# Patient Record
Sex: Female | Born: 1965 | Race: White | Hispanic: No | Marital: Single | Smoking: Current every day smoker
Health system: Southern US, Community
[De-identification: ages and names within clinical notes are randomized; demographics above are authoritative.]

## PROBLEM LIST (undated history)

## (undated) DIAGNOSIS — I10 Essential (primary) hypertension: Secondary | ICD-10-CM

## (undated) HISTORY — PX: OTHER SURGICAL HISTORY: SHX169

---

## 2007-07-16 ENCOUNTER — Encounter: Payer: Self-pay | Admitting: Physician Assistant

## 2008-07-15 ENCOUNTER — Inpatient Hospital Stay (HOSPITAL_COMMUNITY): Admission: EM | Admit: 2008-07-15 | Discharge: 2008-07-16 | Payer: Self-pay | Admitting: Emergency Medicine

## 2008-07-15 ENCOUNTER — Ambulatory Visit: Payer: Self-pay | Admitting: Internal Medicine

## 2008-09-24 ENCOUNTER — Ambulatory Visit: Payer: Self-pay | Admitting: Internal Medicine

## 2008-10-16 ENCOUNTER — Encounter: Payer: Self-pay | Admitting: Physician Assistant

## 2008-12-14 ENCOUNTER — Emergency Department (HOSPITAL_COMMUNITY): Admission: EM | Admit: 2008-12-14 | Discharge: 2008-12-15 | Payer: Self-pay | Admitting: Emergency Medicine

## 2010-04-05 NOTE — Letter (Signed)
Summary: RECEIVED MORE RECORDS FROM [facility]  RECEIVED MORE RECORDS FROM [facility]   Imported By: Arta Bruce 12/31/2009 14:34:11  _____________________________________________________________________  External Attachment:    Type:   Image     Comment:   External Document

## 2010-04-05 NOTE — Letter (Signed)
Summary: RECORDS FROM [facility] FROM [facility]   Imported By: Arta Bruce 12/31/2009 12:57:04  _____________________________________________________________________  External Attachment:    Type:   Image     Comment:   External Document

## 2010-06-09 LAB — COMPREHENSIVE METABOLIC PANEL
AST: 107 U/L — ABNORMAL HIGH (ref 0–37)
CO2: 22 mEq/L (ref 19–32)
GFR calc non Af Amer: >60 mL/min (ref >60)
Glucose, Bld: 164 mg/dL — ABNORMAL HIGH (ref 70–99)
Potassium: 3.4 mEq/L — ABNORMAL LOW (ref 3.5–5.1)
Sodium: 132 mEq/L — ABNORMAL LOW (ref 135–145)
Total Bilirubin: 1.3 mg/dL — ABNORMAL HIGH (ref 0.3–1.2)
Total Protein: 8.3 g/dL (ref 6.0–8.3)

## 2010-06-09 LAB — CBC
Hemoglobin: 12.7 g/dL (ref 12.0–15.0)
RBC: 4.15 MIL/uL (ref 3.87–5.11)
RDW: 22 % — ABNORMAL HIGH (ref 11.5–15.5)

## 2010-06-14 LAB — RAPID URINE DRUG SCREEN, HOSP PERFORMED
Amphetamines: NOT DETECTED
Benzodiazepines: NOT DETECTED

## 2010-06-14 LAB — BASIC METABOLIC PANEL
BUN: 12 mg/dL (ref 6–23)
Calcium: 7.9 mg/dL — ABNORMAL LOW (ref 8.4–10.5)
Calcium: 8.2 mg/dL — ABNORMAL LOW (ref 8.4–10.5)
Creatinine, Ser: 0.71 mg/dL (ref 0.4–1.2)
Creatinine, Ser: 0.78 mg/dL (ref 0.4–1.2)
GFR calc Af Amer: >60 mL/min (ref >60)
GFR calc Af Amer: >60 mL/min (ref >60)
GFR calc non Af Amer: >60 mL/min (ref >60)
Sodium: 133 mEq/L — ABNORMAL LOW (ref 135–145)

## 2010-06-14 LAB — DIFFERENTIAL
Basophils Absolute: 0 10*3/uL (ref 0.0–0.1)
Eosinophils Absolute: 0 10*3/uL (ref 0.0–0.7)
Lymphocytes Relative: 13 % (ref 12–46)
Neutro Abs: 4 10*3/uL (ref 1.7–7.7)
Neutrophils Relative %: 85 % — ABNORMAL HIGH (ref 43–77)

## 2010-06-14 LAB — LIPID PANEL
HDL: 74 mg/dL (ref >39)
LDL Cholesterol: 108 mg/dL — ABNORMAL HIGH (ref 0–99)
Total CHOL/HDL Ratio: 2.6 RATIO
Triglycerides: 46 mg/dL (ref <150)
VLDL: 9 mg/dL (ref 0–40)

## 2010-06-14 LAB — URINALYSIS, ROUTINE W REFLEX MICROSCOPIC
Nitrite: NEGATIVE
Specific Gravity, Urine: 1.016 (ref 1.005–1.030)
Urobilinogen, UA: 0.2 mg/dL (ref 0.0–1.0)
pH: 7 (ref 5.0–8.0)

## 2010-06-14 LAB — BRAIN NATRIURETIC PEPTIDE: Pro B Natriuretic peptide (BNP): <30 pg/mL (ref 0.0–100.0)

## 2010-06-14 LAB — URINE MICROSCOPIC-ADD ON

## 2010-06-14 LAB — COMPREHENSIVE METABOLIC PANEL
Alkaline Phosphatase: 65 U/L (ref 39–117)
BUN: 10 mg/dL (ref 6–23)
Glucose, Bld: 126 mg/dL — ABNORMAL HIGH (ref 70–99)
Potassium: 3.5 mEq/L (ref 3.5–5.1)
Total Bilirubin: 0.4 mg/dL (ref 0.3–1.2)
Total Protein: 6.8 g/dL (ref 6.0–8.3)

## 2010-06-14 LAB — RETICULOCYTES
RBC.: 4.04 MIL/uL (ref 3.87–5.11)
Retic Count, Absolute: 28.3 10*3/uL (ref 19.0–186.0)

## 2010-06-14 LAB — CK TOTAL AND CKMB (NOT AT ARMC)
CK, MB: 3.4 ng/mL (ref 0.3–4.0)
Relative Index: INVALID (ref 0.0–2.5)
Total CK: 87 U/L (ref 7–177)

## 2010-06-14 LAB — CARDIAC PANEL(CRET KIN+CKTOT+MB+TROPI)
Relative Index: INVALID (ref 0.0–2.5)
Total CK: 103 U/L (ref 7–177)
Troponin I: 0.01 ng/mL (ref 0.00–0.06)
Troponin I: 0.01 ng/mL (ref 0.00–0.06)

## 2010-06-14 LAB — SEDIMENTATION RATE: Sed Rate: 28 mm/hr — ABNORMAL HIGH (ref 0–22)

## 2010-06-14 LAB — PROTIME-INR
INR: 1 (ref 0.00–1.49)
Prothrombin Time: 12.9 seconds (ref 11.6–15.2)

## 2010-06-14 LAB — TSH: TSH: 0.736 u[IU]/mL (ref 0.350–4.500)

## 2010-06-14 LAB — CBC
HCT: 29.8 % — ABNORMAL LOW (ref 36.0–46.0)
MCHC: 33.4 g/dL (ref 30.0–36.0)
MCV: 77.9 fL — ABNORMAL LOW (ref 78.0–100.0)
Platelets: 135 10*3/uL — ABNORMAL LOW (ref 150–400)
RBC: 4.12 MIL/uL (ref 3.87–5.11)
WBC: 4.7 10*3/uL (ref 4.0–10.5)

## 2010-06-14 LAB — IRON AND TIBC
Iron: 18 ug/dL — ABNORMAL LOW (ref 42–135)
UIBC: 301 ug/dL

## 2010-06-14 LAB — HEMOGLOBIN A1C
Hgb A1c MFr Bld: 4.9 % (ref 4.6–6.1)
Mean Plasma Glucose: 94 mg/dL

## 2010-06-14 LAB — GLUCOSE, CAPILLARY: Glucose-Capillary: 78 mg/dL (ref 70–99)

## 2010-06-14 LAB — ETHANOL: Alcohol, Ethyl (B): 97 mg/dL — ABNORMAL HIGH (ref 0–10)

## 2010-06-14 LAB — TROPONIN I: Troponin I: 0.03 ng/mL (ref 0.00–0.06)

## 2010-07-19 NOTE — Discharge Summary (Signed)
NAME:  Teresa House, Teresa House             ACCOUNT NO.:  [id number]   MEDICAL RECORD NO.:  [id number]          PATIENT TYPE:  INP   LOCATION:  3710                         FACILITY:  [facility]   PHYSICIAN:  Madaline Guthrie, M.D.    DATE OF BIRTH:  1965-07-24   DATE OF ADMISSION:  07/15/2008  DATE OF DISCHARGE:  07/16/2008                               DISCHARGE SUMMARY   Attending: Dr. Pervis Hocking   DISCHARGE DIAGNOSES:  1. Chest pain - Secondary to cocaine use, myocardial infarction ruled      out.  2. Hypertension - Chronic home meds include clonidine, lisinopril.  3. Hypercholesterolemia - Home meds simvastatin.  4. Polysubstance abuse including alcohol, cocaine, tobacco.  5. History of 1 grand mal seizure during detox from alcohol in 2008.  6. History of 2 acute myocardial infarctions in 2009 one month apart -      Patient refused cath at the time.   DISCHARGE MEDICATIONS:  1. Multivitamin take 1 tablet once daily.  2. Folic acid 1 mg tablet once daily.  3. Thiamine 100 mg tablet once daily.  4. Clonidine 0.5 mg tablet 3 times per day.  5. Lisinopril 10 mg tablet once daily.  6. Simvastatin 40 mg tablet once daily.   DISPOSITION AND FOLLOWUP:  Patient was discharged from the hospital with  no chest pain.  MI was ruled out by 3 sets of cardiac enzymes being  negative and review of tele did not show any acute findings.  Consultation for alcohol, tobacco, and cocaine cessation was provided  during the hospitalization.  Patient will follow up at [facility] in 2  weeks and will be called for the appointment date.  She was offered a  detox but has refused and Child psychotherapist provided brochures and contact  information in case patient changes mind.   PROCEDURES:  Jul 15, 2008, chest x-ray, no acute cardiopulmonary  changes, mild peribronchial thickening.   CONSULTATIONS:  None.   HPI:  A 45 year old female with history of 2 AMIs in 2009 secondary to  cocaine use and 1 grand mal seizure  secondary to alcohol withdrawal,  history of chronic PSA including cocaine, tobacco, and alcohol,  hypertension, and hyperlipidemia, presents to [facility] ED with  pressure-like mid chest pain that started 1 week ago after she took  cocaine.  Chest pain is 7/10 in severity, radiating to the back, lasting  for few hours, associated with numbness in left arm and leg, and  palpitations.  No shortness of breath.  No fever, chills.  Pain relieved  by nitroglycerin and aspirin in ED.   PHYSICAL EXAM:  Temperature 97.  Blood pressure 121/89.  Pulse 67.  Respirations 16.  Saturation 100% on 2 L.  GENERAL:  Slightly anxious appearing, not in acute distress.  HEENT:  PERRLA.  Moist mucous membranes.  Oropharynx pink and moist.  NECK:  Supple.  HEART:  Regular rate and rhythm.  No murmurs, rubs or gallops.  LUNGS:  Coarse breath sounds.  Clear to auscultation bilaterally.  Good  air movement.  ABDOMEN:  Soft, nontender, nondistended.  Bowel sounds present.  NEUROLOGICAL:  Alert, oriented x3.  No asterixis.  Cranial nerves II-XII  intact.  Strength 5/5 throughout.  Tremor on outstretched hands.   LABS:  Sodium 133, potassium 3.1, chloride 100, bicarb 18, BUN 13,  creatinine 0.78, and glucose 221.  WBC 4.7, ANC 4, hemoglobin 10.7, MCV  78, platelets 176.   HOSPITAL COURSE BY PROBLEM:  1. Chest pain with atypical features - AC/AMI ruled out.  Cardiac      enzymes x3 negative.  On EKG, normal sinus rhythm at 74 beats per      minute, normal axis, good R-wave progression, no ST-T wave      abnormalities, P-wave inversion in lead 3.  Review of telemetry did      not show any acute ST-T wave abnormalities.  UDS positive for      cocaine.  On chest x-ray, no acute cardiopulmonary findings noted.      Other etiologies considered and subsequently ruled out such as GI      pathologies, liver pathologies, LFTs within normal limits.  Lipase      within normal limits.  No history of NSAID use.  2.  Polysubstance abuse.  UDS positive for cocaine.  Patient continues      to use cocaine frequently, as well as alcohol and tobacco.      Consultation on cessation was provided.  Patient was started on      thiamine, folic acid, and multivitamin.  This treatment to be      continued as an outpatient.  It was noted that the morning of      discharge patient did have significant tremor likely secondary to      withdrawal symptoms.  She was offered detox but has refused.      Education was provided on importance of cocaine and alcohol      cessation.  Patient will follow up at [facility].  3. Hypokalemia.  This was secondary to malnutrition, probably chronic      alcohol abuse.  Magnesium was checked and was within normal limits.      Potassium repleted and on discharge was within normal limits at      3.7.   LABS ON DISCHARGE AND VITALS:  Temperature 97.9.  Blood pressure 142/99.  Pulse 79.  Respirations 20.  Saturating 98% on room air.  Labs, sodium  131, potassium 3.7, chloride 103, bicarb 22, BUN 12, creatinine 0.71,  glucose 85, WBC 5.0, hemoglobin 10, platelets 135.   Over 30 minutes was spent on discharging the patient.      [name], MD  Electronically Signed      Madaline Guthrie, M.D.  Electronically Signed    IM/MEDQ  D:  07/16/2008  T:  07/16/2008  Job:  [id number]

## 2013-02-06 ENCOUNTER — Encounter (HOSPITAL_COMMUNITY): Payer: Self-pay | Admitting: Emergency Medicine

## 2013-02-06 ENCOUNTER — Emergency Department (HOSPITAL_COMMUNITY)
Admission: EM | Admit: 2013-02-06 | Discharge: 2013-02-06 | Disposition: A | Discharge status: Home / Self Care | Payer: Self-pay | Attending: Emergency Medicine | Admitting: Emergency Medicine

## 2013-02-06 DIAGNOSIS — F17200 Nicotine dependence, unspecified, uncomplicated: Secondary | ICD-10-CM | POA: Insufficient documentation

## 2013-02-06 DIAGNOSIS — Z3202 Encounter for pregnancy test, result negative: Secondary | ICD-10-CM | POA: Insufficient documentation

## 2013-02-06 DIAGNOSIS — Z7982 Long term (current) use of aspirin: Secondary | ICD-10-CM | POA: Insufficient documentation

## 2013-02-06 DIAGNOSIS — K759 Inflammatory liver disease, unspecified: Secondary | ICD-10-CM | POA: Insufficient documentation

## 2013-02-06 DIAGNOSIS — I10 Essential (primary) hypertension: Secondary | ICD-10-CM | POA: Insufficient documentation

## 2013-02-06 DIAGNOSIS — F1010 Alcohol abuse, uncomplicated: Secondary | ICD-10-CM | POA: Insufficient documentation

## 2013-02-06 DIAGNOSIS — Z880 Allergy status to penicillin: Secondary | ICD-10-CM | POA: Insufficient documentation

## 2013-02-06 HISTORY — DX: Essential (primary) hypertension: I10

## 2013-02-06 LAB — COMPREHENSIVE METABOLIC PANEL
ALT: 53 U/L — ABNORMAL HIGH (ref 0–35)
AST: 108 U/L — ABNORMAL HIGH (ref 0–37)
Calcium: 8.5 mg/dL (ref 8.4–10.5)
Sodium: 127 mEq/L — ABNORMAL LOW (ref 135–145)
Total Protein: 6 g/dL (ref 6.0–8.3)

## 2013-02-06 LAB — URINALYSIS, ROUTINE W REFLEX MICROSCOPIC
Glucose, UA: NEGATIVE mg/dL
Hgb urine dipstick: NEGATIVE
Protein, ur: NEGATIVE mg/dL
pH: 7 (ref 5.0–8.0)

## 2013-02-06 LAB — CBC WITH DIFFERENTIAL/PLATELET
Basophils Absolute: 0 10*3/uL (ref 0.0–0.1)
Eosinophils Absolute: 0 10*3/uL (ref 0.0–0.7)
Eosinophils Relative: 0 % (ref 0–5)
Hemoglobin: 10.8 g/dL — ABNORMAL LOW (ref 12.0–15.0)
MCH: 38.3 pg — ABNORMAL HIGH (ref 26.0–34.0)
MCV: 109.6 fL — ABNORMAL HIGH (ref 78.0–100.0)
Platelets: 171 10*3/uL (ref 150–400)
RBC: 2.82 MIL/uL — ABNORMAL LOW (ref 3.87–5.11)
RDW: 20.9 % — ABNORMAL HIGH (ref 11.5–15.5)

## 2013-02-06 LAB — URINE MICROSCOPIC-ADD ON

## 2013-02-06 LAB — LIPASE, BLOOD: Lipase: 60 U/L — ABNORMAL HIGH (ref 11–59)

## 2013-02-06 NOTE — ED Notes (Signed)
Pt reports she had flu like symptoms for past few weeks, coughing and body aches. Then last week she noticed yellowing of her eyes and face. Reports n/v throughout the week as well. Denies hx liver problems. Reports she is a heavy drinker but has not had a drink since last week when she noticed her eyes were yellow

## 2013-02-06 NOTE — ED Provider Notes (Signed)
CSN: [id number]     Arrival date & time 02/06/13  6295 History   First MD Initiated Contact with Patient 02/06/13 [phone]     Chief Complaint  Patient presents with  . Jaundice   (Consider location/radiation/quality/duration/timing/severity/associated sxs/prior Treatment) HPI Comments: Teresa House is a 47 y.o. female who reports that she is concerned about the color of her skin, being yellow. She noticed it last week. She has had generalized achiness for about one month and occasional nausea and vomiting. Because of the change in her skin color, she stopped drinking alcohol last week. She denies recent fever, chills, cough, shortness of breath, chest or abdominal pain. She feels that she has gained 10 pounds recently. She's been told she had hepatitis C in the past, but on subsequent testing was told that she did not have it. She is no known sick contacts. She was drinking alcohol heavily, until last week. She denies other problems. There are no other known modifying factors.  The history is provided by the patient.    Past Medical History  Diagnosis Date  . Hypertension    History reviewed. No pertinent past surgical history. History reviewed. No pertinent family history. History  Substance Use Topics  . Smoking status: Current Every Day Smoker    Types: Cigarettes  . Smokeless tobacco: Not on file  . Alcohol Use: Yes   OB History   Grav Para Term Preterm Abortions TAB SAB Ect Mult Living                 Review of Systems  All other systems reviewed and are negative.    Allergies  Penicillins  Home Medications   Current Outpatient Rx  Name  Route  Sig  Dispense  Refill  . aspirin 81 MG tablet   Oral   Take 81 mg by mouth daily.         . Pseudoeph-Doxylamine-DM-APAP 60-12.08-02-998 MG/30ML LIQD   Oral   Take 30 mLs by mouth every 6 (six) hours as needed (cough).          BP 116/82  Pulse 103  Temp(Src) 98.2 F (36.8 C) (Oral)  Resp 22  Ht 5\' 6"  (1.676 m)   Wt 122 lb 3.2 oz (55.43 kg)  BMI 19.73 kg/m2  SpO2 100% Physical Exam  Nursing note and vitals reviewed. Constitutional: She is oriented to person, place, and time. She appears well-developed.  Undernourished  HENT:  Head: Normocephalic and atraumatic.  Eyes: Conjunctivae and EOM are normal. Pupils are equal, round, and reactive to light. Scleral icterus is present.  Neck: Normal range of motion and phonation normal. Neck supple.  Cardiovascular: Normal rate, regular rhythm and intact distal pulses.   Pulmonary/Chest: Effort normal and breath sounds normal. She exhibits no tenderness.  Abdominal: Soft. She exhibits no distension. There is no tenderness. There is no guarding.  Mild distention without palpable fluid wave  Musculoskeletal: Normal range of motion.  Neurological: She is alert and oriented to person, place, and time. She exhibits normal muscle tone.  Skin: Skin is warm and dry.  icteric  Psychiatric: She has a normal mood and affect. Her behavior is normal. Judgment and thought content normal.    ED Course  Procedures (including critical care time)   Labs Review Labs Reviewed  URINALYSIS, ROUTINE W REFLEX MICROSCOPIC - Abnormal; Notable for the following:    Color, Urine AMBER (*)    APPearance HAZY (*)    Bilirubin Urine LARGE (*)  Nitrite POSITIVE (*)    Leukocytes, UA MODERATE (*)    All other components within normal limits  LIPASE, BLOOD - Abnormal; Notable for the following:    Lipase 60 (*)    All other components within normal limits  CBC WITH DIFFERENTIAL - Abnormal; Notable for the following:    WBC 10.9 (*)    RBC 2.82 (*)    Hemoglobin 10.8 (*)    HCT 30.9 (*)    MCV 109.6 (*)    MCH 38.3 (*)    RDW 20.9 (*)    Neutrophils Relative % 78 (*)    Neutro Abs 8.5 (*)    Lymphocytes Relative 9 (*)    Monocytes Absolute 1.3 (*)    All other components within normal limits  COMPREHENSIVE METABOLIC PANEL - Abnormal; Notable for the following:     Sodium 127 (*)    Potassium 3.3 (*)    Chloride 93 (*)    Glucose, Bld 112 (*)    BUN 4 (*)    Creatinine, Ser 0.39 (*)    Albumin 2.1 (*)    AST 108 (*)    ALT 53 (*)    Alkaline Phosphatase 167 (*)    Total Bilirubin 18.2 (*)    All other components within normal limits  URINE MICROSCOPIC-ADD ON - Abnormal; Notable for the following:    Bacteria, UA MANY (*)    All other components within normal limits  POCT PREGNANCY, URINE   Imaging Review No results found.  EKG Interpretation   None       MDM   1. Hepatitis   2. Alcohol abuse      Evaluation, consistent with acute hepatitis likely alcohol induced. She does not have stigmata of acute viral hepatitis. She stable for discharge to outpatient followup with a gastroenterologist, and a primary care doctor. The patient understands that she needs to something alcohol. Her partner is with her, and he is supportive of this effort.   Nursing Notes Reviewed/ Care Coordinated, and agree without changes. Applicable Imaging Reviewed.  Interpretation of Laboratory Data incorporated into ED treatment   Plan: Home Medications- none; Home Treatments and Observation- rest, fluids, avoid alcohol; return here if the recommended treatment, does not improve the symptoms; Recommended follow up- to GI and PCP followup, as soon as possible.     Flint Melter, MD 02/06/13 1257

## 2013-02-11 ENCOUNTER — Encounter (HOSPITAL_COMMUNITY): Payer: Self-pay | Admitting: Emergency Medicine

## 2013-02-11 ENCOUNTER — Inpatient Hospital Stay (HOSPITAL_COMMUNITY): Payer: Medicaid Other

## 2013-02-11 ENCOUNTER — Inpatient Hospital Stay (HOSPITAL_COMMUNITY)
Admission: EM | Admit: 2013-02-11 | Discharge: 2013-03-06 | DRG: 432 | Disposition: E | Payer: Medicaid Other | Attending: Internal Medicine | Admitting: Internal Medicine

## 2013-02-11 DIAGNOSIS — R34 Anuria and oliguria: Secondary | ICD-10-CM | POA: Diagnosis present

## 2013-02-11 DIAGNOSIS — Z66 Do not resuscitate: Secondary | ICD-10-CM | POA: Diagnosis not present

## 2013-02-11 DIAGNOSIS — K767 Hepatorenal syndrome: Secondary | ICD-10-CM | POA: Diagnosis present

## 2013-02-11 DIAGNOSIS — B1920 Unspecified viral hepatitis C without hepatic coma: Secondary | ICD-10-CM

## 2013-02-11 DIAGNOSIS — N390 Urinary tract infection, site not specified: Secondary | ICD-10-CM | POA: Diagnosis present

## 2013-02-11 DIAGNOSIS — A419 Sepsis, unspecified organism: Secondary | ICD-10-CM

## 2013-02-11 DIAGNOSIS — E876 Hypokalemia: Secondary | ICD-10-CM

## 2013-02-11 DIAGNOSIS — I10 Essential (primary) hypertension: Secondary | ICD-10-CM | POA: Diagnosis present

## 2013-02-11 DIAGNOSIS — Z720 Tobacco use: Secondary | ICD-10-CM | POA: Diagnosis present

## 2013-02-11 DIAGNOSIS — A599 Trichomoniasis, unspecified: Secondary | ICD-10-CM

## 2013-02-11 DIAGNOSIS — F17200 Nicotine dependence, unspecified, uncomplicated: Secondary | ICD-10-CM

## 2013-02-11 DIAGNOSIS — K769 Liver disease, unspecified: Secondary | ICD-10-CM | POA: Diagnosis present

## 2013-02-11 DIAGNOSIS — K7290 Hepatic failure, unspecified without coma: Secondary | ICD-10-CM

## 2013-02-11 DIAGNOSIS — K7010 Alcoholic hepatitis without ascites: Secondary | ICD-10-CM | POA: Diagnosis present

## 2013-02-11 DIAGNOSIS — Z7982 Long term (current) use of aspirin: Secondary | ICD-10-CM

## 2013-02-11 DIAGNOSIS — E872 Acidosis, unspecified: Secondary | ICD-10-CM

## 2013-02-11 DIAGNOSIS — I2510 Atherosclerotic heart disease of native coronary artery without angina pectoris: Secondary | ICD-10-CM | POA: Diagnosis present

## 2013-02-11 DIAGNOSIS — E871 Hypo-osmolality and hyponatremia: Secondary | ICD-10-CM

## 2013-02-11 DIAGNOSIS — R188 Other ascites: Secondary | ICD-10-CM

## 2013-02-11 DIAGNOSIS — F1011 Alcohol abuse, in remission: Secondary | ICD-10-CM

## 2013-02-11 DIAGNOSIS — A498 Other bacterial infections of unspecified site: Secondary | ICD-10-CM | POA: Diagnosis present

## 2013-02-11 DIAGNOSIS — F1020 Alcohol dependence, uncomplicated: Secondary | ICD-10-CM | POA: Diagnosis present

## 2013-02-11 DIAGNOSIS — R279 Unspecified lack of coordination: Secondary | ICD-10-CM | POA: Diagnosis present

## 2013-02-11 DIAGNOSIS — I959 Hypotension, unspecified: Secondary | ICD-10-CM

## 2013-02-11 DIAGNOSIS — R531 Weakness: Secondary | ICD-10-CM | POA: Diagnosis present

## 2013-02-11 DIAGNOSIS — N179 Acute kidney failure, unspecified: Secondary | ICD-10-CM

## 2013-02-11 DIAGNOSIS — A5901 Trichomonal vulvovaginitis: Secondary | ICD-10-CM | POA: Diagnosis present

## 2013-02-11 DIAGNOSIS — R109 Unspecified abdominal pain: Secondary | ICD-10-CM

## 2013-02-11 DIAGNOSIS — K7030 Alcoholic cirrhosis of liver without ascites: Principal | ICD-10-CM

## 2013-02-11 DIAGNOSIS — Z515 Encounter for palliative care: Secondary | ICD-10-CM

## 2013-02-11 DIAGNOSIS — K759 Inflammatory liver disease, unspecified: Secondary | ICD-10-CM

## 2013-02-11 DIAGNOSIS — R17 Unspecified jaundice: Secondary | ICD-10-CM

## 2013-02-11 LAB — COMPREHENSIVE METABOLIC PANEL
ALT: 76 U/L — ABNORMAL HIGH (ref 0–35)
AST: 144 U/L — ABNORMAL HIGH (ref 0–37)
Alkaline Phosphatase: 181 U/L — ABNORMAL HIGH (ref 39–117)
GFR calc Af Amer: >90 mL/min (ref >90)
Glucose, Bld: 116 mg/dL — ABNORMAL HIGH (ref 70–99)
Potassium: 3.5 mEq/L (ref 3.5–5.1)
Sodium: 115 mEq/L — CL (ref 135–145)
Total Protein: 5.4 g/dL — ABNORMAL LOW (ref 6.0–8.3)

## 2013-02-11 LAB — CBC WITH DIFFERENTIAL/PLATELET
Basophils Absolute: 0 10*3/uL (ref 0.0–0.1)
Eosinophils Absolute: 0.1 10*3/uL (ref 0.0–0.7)
Lymphocytes Relative: 5 % — ABNORMAL LOW (ref 12–46)
Lymphs Abs: 0.8 10*3/uL (ref 0.7–4.0)
MCH: 38.8 pg — ABNORMAL HIGH (ref 26.0–34.0)
Neutrophils Relative %: 87 % — ABNORMAL HIGH (ref 43–77)
Platelets: 181 10*3/uL (ref 150–400)
RBC: 3.27 MIL/uL — ABNORMAL LOW (ref 3.87–5.11)
WBC: 15.5 10*3/uL — ABNORMAL HIGH (ref 4.0–10.5)

## 2013-02-11 LAB — URINALYSIS W MICROSCOPIC + REFLEX CULTURE
Glucose, UA: NEGATIVE mg/dL
Hgb urine dipstick: NEGATIVE
Specific Gravity, Urine: 1.018 (ref 1.005–1.030)
pH: 5.5 (ref 5.0–8.0)

## 2013-02-11 LAB — POCT PREGNANCY, URINE: Preg Test, Ur: NEGATIVE

## 2013-02-11 LAB — AMMONIA: Ammonia: 28 umol/L (ref 11–60)

## 2013-02-11 LAB — PROTIME-INR: Prothrombin Time: 17.3 seconds — ABNORMAL HIGH (ref 11.6–15.2)

## 2013-02-11 LAB — APTT: aPTT: 29 seconds (ref 24–37)

## 2013-02-11 MED ORDER — THIAMINE HCL 100 MG/ML IJ SOLN
100.0000 mg | Freq: Every day | INTRAMUSCULAR | Status: DC
  Administered 2013-02-14: 100 mg via INTRAVENOUS
  Filled 2013-02-11 (×4): qty 1

## 2013-02-11 MED ORDER — LORAZEPAM 1 MG PO TABS: 1.0000 mg | ORAL_TABLET | Freq: Four times a day (QID) | ORAL | Status: AC | PRN

## 2013-02-11 MED ORDER — ADULT MULTIVITAMIN W/MINERALS CH
1.0000 | ORAL_TABLET | Freq: Every day | ORAL | Status: DC
  Administered 2013-02-12 – 2013-02-16 (×5): 1 via ORAL
  Filled 2013-02-11 (×5): qty 1

## 2013-02-11 MED ORDER — LORAZEPAM 0.5 MG PO TABS
0.0000 mg | ORAL_TABLET | Freq: Four times a day (QID) | ORAL | Status: AC
Start: 2013-02-12 — End: 2013-02-13

## 2013-02-11 MED ORDER — LORAZEPAM 0.5 MG PO TABS
0.0000 mg | ORAL_TABLET | Freq: Two times a day (BID) | ORAL | Status: AC
  Administered 2013-02-14: 1 mg via ORAL
  Filled 2013-02-11: qty 2

## 2013-02-11 MED ORDER — SODIUM CHLORIDE 0.9 % IV SOLN
Freq: Once | INTRAVENOUS | Status: AC
  Administered 2013-02-11: 21:00:00 via INTRAVENOUS

## 2013-02-11 MED ORDER — LORAZEPAM 1 MG PO TABS: 0.0000 mg | ORAL_TABLET | Freq: Four times a day (QID) | ORAL | Status: DC

## 2013-02-11 MED ORDER — ONDANSETRON HCL 4 MG/2ML IJ SOLN
4.0000 mg | Freq: Four times a day (QID) | INTRAMUSCULAR | Status: DC | PRN
  Administered 2013-02-17 – 2013-02-20 (×3): 4 mg via INTRAVENOUS
  Filled 2013-02-11 (×3): qty 2

## 2013-02-11 MED ORDER — DEXTROSE 5 % IV SOLN
1.0000 g | Freq: Once | INTRAVENOUS | Status: AC
  Administered 2013-02-11: 1 g via INTRAVENOUS
  Filled 2013-02-11: qty 10

## 2013-02-11 MED ORDER — CIPROFLOXACIN IN D5W 400 MG/200ML IV SOLN
400.0000 mg | Freq: Two times a day (BID) | INTRAVENOUS | Status: DC
  Administered 2013-02-12 – 2013-02-17 (×12): 400 mg via INTRAVENOUS
  Filled 2013-02-11 (×13): qty 200

## 2013-02-11 MED ORDER — VITAMIN B-1 100 MG PO TABS
100.0000 mg | ORAL_TABLET | Freq: Every day | ORAL | Status: DC
  Administered 2013-02-12 – 2013-02-16 (×4): 100 mg via ORAL
  Filled 2013-02-11 (×5): qty 1

## 2013-02-11 MED ORDER — METRONIDAZOLE IN NACL 5-0.79 MG/ML-% IV SOLN
500.0000 mg | Freq: Three times a day (TID) | INTRAVENOUS | Status: DC
  Administered 2013-02-11 – 2013-02-15 (×11): 500 mg via INTRAVENOUS
  Filled 2013-02-11 (×12): qty 100

## 2013-02-11 MED ORDER — ONDANSETRON HCL 4 MG PO TABS: 4.0000 mg | ORAL_TABLET | Freq: Four times a day (QID) | ORAL | Status: DC | PRN

## 2013-02-11 MED ORDER — SODIUM CHLORIDE 0.9 % IJ SOLN
3.0000 mL | Freq: Two times a day (BID) | INTRAMUSCULAR | Status: DC
  Administered 2013-02-11: 3 mL via INTRAVENOUS

## 2013-02-11 MED ORDER — FOLIC ACID 1 MG PO TABS
1.0000 mg | ORAL_TABLET | Freq: Every day | ORAL | Status: DC
  Administered 2013-02-12 – 2013-02-16 (×5): 1 mg via ORAL
  Filled 2013-02-11 (×5): qty 1

## 2013-02-11 MED ORDER — SODIUM CHLORIDE 0.9 % IJ SOLN
3.0000 mL | Freq: Two times a day (BID) | INTRAMUSCULAR | Status: DC
  Administered 2013-02-13 – 2013-02-21 (×15): 3 mL via INTRAVENOUS

## 2013-02-11 MED ORDER — LORAZEPAM 1 MG PO TABS: 0.0000 mg | ORAL_TABLET | Freq: Two times a day (BID) | ORAL | Status: DC

## 2013-02-11 MED ORDER — SODIUM CHLORIDE 0.9 % IV BOLUS (SEPSIS): 1000.0000 mL | Freq: Once | INTRAVENOUS | Status: DC

## 2013-02-11 MED ORDER — MORPHINE SULFATE 4 MG/ML IJ SOLN
4.0000 mg | Freq: Once | INTRAMUSCULAR | Status: AC
  Administered 2013-02-11: 4 mg via INTRAVENOUS
  Filled 2013-02-11: qty 1

## 2013-02-11 MED ORDER — ONDANSETRON HCL 4 MG/2ML IJ SOLN
4.0000 mg | Freq: Once | INTRAMUSCULAR | Status: AC
  Administered 2013-02-11: 4 mg via INTRAVENOUS
  Filled 2013-02-11: qty 2

## 2013-02-11 MED ORDER — LORAZEPAM 2 MG/ML IJ SOLN: 1.0000 mg | Freq: Four times a day (QID) | INTRAMUSCULAR | Status: AC | PRN

## 2013-02-11 NOTE — ED Notes (Addendum)
Pt knows that urine is needed. Pt voided in triage before coming back to the room

## 2013-02-11 NOTE — ED Notes (Signed)
Cassie RN tried IV x2, Artist tried IV x2, Tatyana PA tried [place] IV x2, IV team in with pt.

## 2013-02-11 NOTE — ED Notes (Signed)
Pt was dx w/ hepatitis and cirrhosis on Thursday and is returning today for jaundice, ascites, and ABD pain

## 2013-02-11 NOTE — ED Provider Notes (Signed)
CSN: [id number]     Arrival date & time 27-Feb-2013  1706 History   First MD Initiated Contact with Patient 02/27/2013 1836     Chief Complaint  Patient presents with  . Abdominal Pain   (Consider location/radiation/quality/duration/timing/severity/associated sxs/prior Treatment) HPI Teresa House is a 47 y.o. female with no medical problems up to this point, presents emergency department with complaint of abdominal pain, swelling, yellowing of the skin. She states she is a heavy drinker and has been drinking alcohol for multiple years. States she 1 day woke her up "yellow." States this made her quit drinking. She has not had a drink in 2 weeks. States since then she has had yellowing of the skin has been getting worse. She was seen in emergency department a week ago and was told she had liver problem. Patient states that she was discharged was told to followup but she's unable to do so because does not have any insurance or primary care doctor. Patient states that now her abdomen is very painful and distended. States she has new veins that she's never seen before in her abdomen. Patient denies any fever chills. She denies any nausea or vomiting. Denies any bleeding. Patient has no other complaints.  Past Medical History  Diagnosis Date  . Hypertension    History reviewed. No pertinent past surgical history. History reviewed. No pertinent family history. History  Substance Use Topics  . Smoking status: Current Every Day Smoker    Types: Cigarettes  . Smokeless tobacco: Not on file  . Alcohol Use: Yes   OB History   Grav Para Term Preterm Abortions TAB SAB Ect Mult Living                 Review of Systems  Constitutional: Negative for fever and chills.  Respiratory: Negative for cough, chest tightness and shortness of breath.   Cardiovascular: Negative for chest pain, palpitations and leg swelling.  Gastrointestinal: Positive for abdominal pain. Negative for nausea, vomiting and  diarrhea.  Genitourinary: Negative for dysuria, flank pain, vaginal bleeding, vaginal discharge, vaginal pain and pelvic pain.  Musculoskeletal: Negative for arthralgias, myalgias, neck pain and neck stiffness.  Skin: Negative for rash.  Neurological: Negative for dizziness, weakness, numbness and headaches.  All other systems reviewed and are negative.    Allergies  Penicillins  Home Medications   Current Outpatient Rx  Name  Route  Sig  Dispense  Refill  . aspirin 81 MG tablet   Oral   Take 81 mg by mouth daily.         . Pseudoeph-Doxylamine-DM-APAP 60-12.08-02-998 MG/30ML LIQD   Oral   Take 30 mLs by mouth every 6 (six) hours as needed (cough).          BP 104/74  Pulse 93  Temp(Src) 97.8 F (36.6 C) (Oral)  Resp 18  SpO2 100% Physical Exam  Nursing note and vitals reviewed. Constitutional: She is oriented to person, place, and time. She appears well-developed and well-nourished. No distress.  HENT:  Head: Normocephalic.  Eyes: Conjunctivae are normal. Scleral icterus is present.  Neck: Neck supple.  Cardiovascular: Normal rate, regular rhythm and normal heart sounds.   Pulmonary/Chest: Effort normal and breath sounds normal. No respiratory distress. She has no wheezes. She has no rales.  Abdominal: Soft. Bowel sounds are normal. She exhibits distension. There is tenderness. There is no rebound and no guarding.  Diffuse tenderness  Musculoskeletal: She exhibits no edema.  Neurological: She is alert and oriented to person,  place, and time.  Skin: Skin is warm and dry.  Skin is jaundiced  Psychiatric: She has a normal mood and affect. Her behavior is normal.    ED Course  Procedures (including critical care time) Labs Review Labs Reviewed  CBC WITH DIFFERENTIAL - Abnormal; Notable for the following:    WBC 15.5 (*)    RBC 3.27 (*)    HCT 35.2 (*)    MCV 107.6 (*)    MCH 38.8 (*)    MCHC 36.1 (*)    RDW 17.2 (*)    Neutrophils Relative % 87 (*)     Neutro Abs 13.5 (*)    Lymphocytes Relative 5 (*)    Monocytes Absolute 1.1 (*)    All other components within normal limits  COMPREHENSIVE METABOLIC PANEL - Abnormal; Notable for the following:    Sodium 115 (*)    Chloride 83 (*)    CO2 18 (*)    Glucose, Bld 116 (*)    Calcium 7.7 (*)    Total Protein 5.4 (*)    Albumin 1.9 (*)    AST 144 (*)    ALT 76 (*)    Alkaline Phosphatase 181 (*)    Total Bilirubin 26.9 (*)    All other components within normal limits  PROTIME-INR - Abnormal; Notable for the following:    Prothrombin Time 17.3 (*)    All other components within normal limits  LIPASE, BLOOD  AMMONIA  ETHANOL  APTT  URINALYSIS W MICROSCOPIC + REFLEX CULTURE  HEPATITIS PANEL, ACUTE  POCT PREGNANCY, URINE   Imaging Review No results found.  EKG Interpretation   None       MDM   1. Liver failure   2. Jaundice   3. Ascites   4. Hyponatremia   5. Elevated bilirubin   6. Abdominal  pain, other specified site   7. History of alcohol abuse     Patient with jaundice, abdominal pain distention, history of long-standing alcohol abuse. States quit drinking 2 weeks ago. 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 Her bilirubin today is 26.9. Her sodium is 1:15. The rest of the electrolytes as above. Will start on IV fluids, pain management, will admit to medicine.  8:52 PM Discussed with triad, will admit.  Lottie Mussel, PA-C 02-25-2013 2052

## 2013-02-11 NOTE — Progress Notes (Signed)
ANTIBIOTIC CONSULT NOTE - INITIAL  Pharmacy Consult for Cipro Indication: SBP Prophylaxis  Allergies  Allergen Reactions  . Penicillins Other (See Comments)    Childhood reaction     Patient Measurements: Height: 5\' 6"  (167.6 cm) Weight: 129 lb 13.6 oz (58.9 kg) IBW/kg (Calculated) : 59.3  Vital Signs: Temp: 97.8 F (36.6 C) (12/09 1707) Temp src: Oral (12/09 1707) BP: 118/76 mmHg (12/09 1930) Pulse Rate: 88 (12/09 1930) Labs:  Recent Labs  02/27/2013 1851  WBC 15.5*  HGB 12.7  PLT 181  CREATININE 0.52   Estimated Creatinine Clearance: 82.6 ml/min (by C-G formula based on Cr of 0.52).  Assessment: 47 y/o with abdominal pain, longtime drinker now with jaundice, WBC 15.5, TBili 26.9, other labs above, already on flagyl per MD.   Goal of Therapy:  Clinical resolution   Plan:  -Flagyl per MD -Start Cipro 400 mg IV q12h -Trend clinical status, labs  Thank you for allowing me to take part in this patient's care,  Abran Duke, PharmD Clinical Pharmacist Phone: [phone] Pager: [phone] 02/13/2013 11:19 PM

## 2013-02-11 NOTE — ED Provider Notes (Signed)
Patient reports she's been a heavy drinker for a long time. She states about 2 weeks ago she woke up one morning and her skin was yellow. She complains of some diffuse abdominal pain however her worst pain is in the upper abdomen all the way across. She states she was seen in the ED and since that prior visits she now has abdominal swelling. She denies any swelling of her legs. She denies any nausea or vomiting. She denies any fevers. Patient states she had been tapering her alcohol intake down and since she turned yellow she has stopped drinking completely.  Medical screening examination/treatment/procedure(s) were conducted as a shared visit with non-physician practitioner(s) and myself.  I personally evaluated the patient during the encounter.  EKG Interpretation   None        Devoria Albe, MD, Armando Gang   Ward Givens, MD 03-03-2013 [phone]

## 2013-02-11 NOTE — ED Notes (Signed)
Pt. Having abdominal distention, pain and pt. Is jaundice.

## 2013-02-11 NOTE — H&P (Addendum)
[facility] History and Physical  Teresa House WUJ:[id number] DOB: October 04, 1965 DOA: 2013-02-21  Referring physician: ER physician. PCP: No PCP Per Patient   Chief Complaint: Jaundice with abdominal discomfort.  HPI: Teresa House is a 47 y.o. female presents to the ER for the second time within a week for worsening jaundice. In addition patient also has noticed abdominal discomfort. Patient feels that her abdomen is getting more distended. She denies any nausea vomiting diarrhea fever chills. In the ER patient was found to have elevated LFTs with total bilirubin around 26.9. Sonogram of abdomen shows features concerning for cirrhosis of the liver in addition to ascites. Labs also show severe hyponatremia. 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 She has been slowly tapering off her alcohol intake over the last 2 months. Denies any chest pain or shortness of breath.  Review of Systems: As presented in the history of presenting illness, rest negative.  Past Medical History  Diagnosis Date  . Hypertension    Past Surgical History  Procedure Laterality Date  . Ankle surgry     Social History:  reports that she has been smoking Cigarettes.  She has been smoking about 0.00 packs per day. She does not have any smokeless tobacco history on file. She reports that she drinks alcohol. She reports that she does not use illicit drugs. Where does patient live home. Can patient participate in ADLs? Yes.  Allergies  Allergen Reactions  . Penicillins Other (See Comments)    Childhood reaction     Family History:  Family History  Problem Relation Age of Onset  . Adopted: Yes      Prior to Admission medications   Medication Sig Start Date End Date Taking? Authorizing Provider  aspirin 81 MG tablet Take 81 mg by mouth daily.   Yes Historical Provider, MD  Pseudoeph-Doxylamine-DM-APAP [id number]  MG/30ML LIQD Take 30 mLs by mouth every 6 (six) hours as needed (cough).   Yes Historical Provider, MD    Physical Exam: Filed Vitals:   Feb 21, 2013 1707 21-Feb-2013 1912 Feb 21, 2013 1930  BP: 106/79 104/74 118/76  Pulse: 100 93 88  Temp: 97.8 F (36.6 C)    TempSrc: Oral    Resp: 17 18   SpO2: 99% 100% 100%     General:  Well-developed and poorly nourished.  Eyes: Icterus no pallor.  ENT: No discharge from the ears eyes nose or mouth.  Neck: No mass felt.  Cardiovascular: S1-S2 heard.  Respiratory: No rhonchi or crepitations.  Abdomen: Soft distended no guarding or rigidity.  Skin: Jaundiced.  Musculoskeletal: No edema.  Psychiatric: Appears normal.  Neurologic: Alert awake oriented to time place and person. Moves all extremities.  Labs on Admission:  Basic Metabolic Panel:  Recent Labs Lab 02/06/13 0956 02-21-13 1851  NA 127* 115*  K 3.3* 3.5  CL 93* 83*  CO2 22 18*  GLUCOSE 112* 116*  BUN 4* 14  CREATININE 0.39* 0.52  CALCIUM 8.5 7.7*   Liver Function Tests:  Recent Labs Lab 02/06/13 0956 2013/02/21 1851  AST 108* 144*  ALT 53* 76*  ALKPHOS 167* 181*  BILITOT 18.2* 26.9*  PROT 6.0 5.4*  ALBUMIN 2.1* 1.9*    Recent Labs Lab 02/06/13 0956 21-Feb-2013 1851  LIPASE 60* 38    Recent Labs Lab 02-21-2013 1950  AMMONIA 28   CBC:  Recent Labs Lab 02/06/13 0956 02-21-13 1851  WBC 10.9* 15.5*  NEUTROABS 8.5* 13.5*  HGB 10.8* 12.7  HCT 30.9* 35.2*  MCV 109.6* 107.6*  PLT 171 181   Cardiac Enzymes: No results found for this basename: CKTOTAL, CKMB, CKMBINDEX, TROPONINI,  in the last 168 hours  BNP (last 3 results) No results found for this basename: PROBNP,  in the last 8760 hours CBG: No results found for this basename: GLUCAP,  in the last 168 hours  Radiological Exams on Admission: US Abdomen Complete  02/14/2013   CLINICAL DATA:  Abdominal pain.  History of hypertension.  EXAM: ULTRASOUND ABDOMEN COMPLETE  COMPARISON:  None.  FINDINGS:  Gallbladder:  The gallbladder wall is thickened at 6.6 mm. Within the gallbladder, there is significant layering sludge. No definite stones are identified.  Common bile duct:  Diameter: 8.2 mm without definite stones.  Liver:  Enlarged and echogenic. Question nodular surface. Perihepatic fluid.  IVC:  No abnormality visualized.  Pancreas:  Visualized portion has a normal appearance. There is poor visualization because of overlying bowel gas however.  Spleen:  Normal in appearance, 11.0 cm.  Right Kidney:  Length: 10.7 cm. Echogenicity within normal limits. No mass or hydronephrosis visualized.  Left Kidney:  Length: 12.7 cm. Echogenicity within normal limits. No mass or hydronephrosis visualized.  Abdominal aorta:  No aneurysm visualized.  Other findings:  Note is made of dampened flow within the main portal vein, noted to be approximately 11 centimeters/second. Findings raise the question portal venous hypertension. Note is made of ascites. .  IMPRESSION: 1. Nodular contour of the liver, raising the question of cirrhosis. 2. No focal liver lesion identified. 3. Ascites. 4. Question of portal venous hypertension.  See above. 5. Normal appearance of the kidneys. 6. Thickened gallbladder wall with significant sludge.   Electronically Signed   By: Rosalie Gums M.D.   On: 02/17/2013 22:16    Assessment/Plan Principal Problem:   Hyponatremia Active Problems:   Hepatitis   Tobacco abuse   1. Severe hyponatremia - probably related to cirrhosis of the liver. At this time we will keep patient on fluid restriction 1 L per day and check urine sodium and osmolality along with it TSH. Check metabolic panel frequently to follow sodium trends. Patient eventually will need diuretics. Check HIDA scan. 2. Hepatitis probably alcoholic hepatitis - patient's discriminate score is 11.2. Closely follow LFTs. Since patient has ascites and leukocytosis I have ordered diagnostic and therapeutic paracentesis and until then patient  will be on IV ciprofloxacin for empiric coverage for SBP as patient is allergic to penicillin. Check acute hepatitis panel and Tylenol levels. Consult GI in a.m. 3. Metabolic acidosis - cause not clear. Check lactic acid levels and salicylate levels. Closely follow metabolic panel. 4. Possible UTI and trichomoniasis - patient is on Cipro and Flagyl. Check urine culture. 5. Tobacco and alcohol abuse - patient states she has quit drinking alcohol 2 weeks ago. Advised to quit tobacco abuse. 6. History of CAD - patient states that she was diagnosed with CAD 5 years ago but has not had any stents or CABG. Denies any chest pain now.    Code Status: Full code.  Family Communication: Patient's husband at the bedside.  Disposition Plan: Admit to inpatient.    Catalaya Garr N. [facility] Pager [phone].  If 7PM-7AM, please contact night-coverage [email or web address] Password [email or web address] 02/07/2013, 10:51 PM

## 2013-02-12 ENCOUNTER — Inpatient Hospital Stay (HOSPITAL_COMMUNITY): Payer: Medicaid Other

## 2013-02-12 DIAGNOSIS — E876 Hypokalemia: Secondary | ICD-10-CM | POA: Diagnosis present

## 2013-02-12 DIAGNOSIS — R188 Other ascites: Secondary | ICD-10-CM | POA: Diagnosis present

## 2013-02-12 DIAGNOSIS — A599 Trichomoniasis, unspecified: Secondary | ICD-10-CM | POA: Diagnosis present

## 2013-02-12 DIAGNOSIS — K7030 Alcoholic cirrhosis of liver without ascites: Principal | ICD-10-CM | POA: Diagnosis present

## 2013-02-12 LAB — BODY FLUID CELL COUNT WITH DIFFERENTIAL
Eos, Fluid: 0 %
Lymphs, Fluid: 14 %
Monocyte-Macrophage-Serous Fluid: 80 % (ref 50–90)
Neutrophil Count, Fluid: 6 % (ref 0–25)

## 2013-02-12 LAB — BASIC METABOLIC PANEL
BUN: 15 mg/dL (ref 6–23)
BUN: 16 mg/dL (ref 6–23)
BUN: 17 mg/dL (ref 6–23)
CO2: 15 mEq/L — ABNORMAL LOW (ref 19–32)
CO2: 19 mEq/L (ref 19–32)
CO2: 18 mEq/L — ABNORMAL LOW (ref 19–32)
Calcium: 7.3 mg/dL — ABNORMAL LOW (ref 8.4–10.5)
Calcium: 7.5 mg/dL — ABNORMAL LOW (ref 8.4–10.5)
Calcium: 7.6 mg/dL — ABNORMAL LOW (ref 8.4–10.5)
Calcium: 7.5 mg/dL — ABNORMAL LOW (ref 8.4–10.5)
Chloride: 87 mEq/L — ABNORMAL LOW (ref 96–112)
Chloride: 87 mEq/L — ABNORMAL LOW (ref 96–112)
Creatinine, Ser: 0.54 mg/dL (ref 0.50–1.10)
Creatinine, Ser: 0.57 mg/dL (ref 0.50–1.10)
Creatinine, Ser: 0.66 mg/dL (ref 0.50–1.10)
GFR calc Af Amer: >90 mL/min (ref >90)
GFR calc non Af Amer: >90 mL/min (ref >90)
GFR calc non Af Amer: >90 mL/min (ref >90)
GFR calc non Af Amer: >90 mL/min (ref >90)
GFR calc non Af Amer: >90 mL/min (ref >90)
Glucose, Bld: 93 mg/dL (ref 70–99)
Glucose, Bld: 94 mg/dL (ref 70–99)
Glucose, Bld: 92 mg/dL (ref 70–99)
Potassium: 2.8 mEq/L — ABNORMAL LOW (ref 3.5–5.1)
Sodium: 118 mEq/L — CL (ref 135–145)
Sodium: 117 mEq/L — CL (ref 135–145)
Sodium: 118 mEq/L — CL (ref 135–145)
Sodium: 118 mEq/L — CL (ref 135–145)

## 2013-02-12 LAB — HEPATITIS PANEL, ACUTE
HCV Ab: REACTIVE — AB
Hep A IgM: NONREACTIVE
Hepatitis B Surface Ag: NEGATIVE

## 2013-02-12 LAB — HEPATIC FUNCTION PANEL
ALT: 72 U/L — ABNORMAL HIGH (ref 0–35)
AST: 140 U/L — ABNORMAL HIGH (ref 0–37)
Albumin: 1.7 g/dL — ABNORMAL LOW (ref 3.5–5.2)
Alkaline Phosphatase: 161 U/L — ABNORMAL HIGH (ref 39–117)
Bilirubin, Direct: 17.5 mg/dL — ABNORMAL HIGH (ref 0.0–0.3)
Indirect Bilirubin: 7.6 mg/dL — ABNORMAL HIGH (ref 0.3–0.9)
Total Bilirubin: 25.1 mg/dL (ref 0.3–1.2)

## 2013-02-12 LAB — LACTIC ACID, PLASMA: Lactic Acid, Venous: 1.5 mmol/L (ref 0.5–2.2)

## 2013-02-12 LAB — ALBUMIN, FLUID (OTHER)

## 2013-02-12 LAB — GRAM STAIN

## 2013-02-12 LAB — MRSA PCR SCREENING: MRSA by PCR: NEGATIVE

## 2013-02-12 LAB — OSMOLALITY, URINE: Osmolality, Ur: 257 mOsm/kg — ABNORMAL LOW (ref 390–1090)

## 2013-02-12 LAB — ACETAMINOPHEN LEVEL: Acetaminophen (Tylenol), Serum: <10 ug/mL — ABNORMAL LOW (ref 10–30)

## 2013-02-12 MED ORDER — SPIRONOLACTONE 12.5 MG HALF TABLET
12.5000 mg | ORAL_TABLET | Freq: Every day | ORAL | Status: DC
  Administered 2013-02-12 – 2013-02-16 (×5): 12.5 mg via ORAL
  Filled 2013-02-12 (×5): qty 1

## 2013-02-12 MED ORDER — MORPHINE SULFATE 4 MG/ML IJ SOLN
4.0000 mg | Freq: Once | INTRAMUSCULAR | Status: AC
  Administered 2013-02-12: 4 mg via INTRAVENOUS
  Filled 2013-02-12: qty 1

## 2013-02-12 MED ORDER — OXYCODONE HCL 5 MG PO TABS
5.0000 mg | ORAL_TABLET | ORAL | Status: DC | PRN
  Administered 2013-02-13 – 2013-02-16 (×7): 5 mg via ORAL
  Filled 2013-02-12 (×8): qty 1

## 2013-02-12 MED ORDER — TECHNETIUM TC 99M MEBROFENIN IV KIT
8.0000 | PACK | Freq: Once | INTRAVENOUS | Status: AC | PRN
  Administered 2013-02-12: 8 via INTRAVENOUS

## 2013-02-12 MED ORDER — POTASSIUM CHLORIDE 10 MEQ/100ML IV SOLN
10.0000 meq | INTRAVENOUS | Status: AC
  Administered 2013-02-12 – 2013-02-13 (×2): 10 meq via INTRAVENOUS
  Filled 2013-02-12 (×2): qty 100

## 2013-02-12 MED ORDER — FUROSEMIDE 10 MG/ML IJ SOLN
20.0000 mg | Freq: Two times a day (BID) | INTRAMUSCULAR | Status: DC
  Administered 2013-02-12 – 2013-02-14 (×5): 20 mg via INTRAVENOUS
  Filled 2013-02-12 (×6): qty 2

## 2013-02-12 MED ORDER — POTASSIUM CHLORIDE 10 MEQ/100ML IV SOLN
10.0000 meq | INTRAVENOUS | Status: AC
  Administered 2013-02-12 (×2): 10 meq via INTRAVENOUS
  Filled 2013-02-12 (×2): qty 100

## 2013-02-12 MED ORDER — METRONIDAZOLE IN NACL 5-0.79 MG/ML-% IV SOLN: 500.0000 mg | Freq: Two times a day (BID) | INTRAVENOUS | Status: DC

## 2013-02-12 MED ORDER — FUROSEMIDE 10 MG/ML IJ SOLN
INTRAMUSCULAR | Status: AC
  Filled 2013-02-12: qty 4

## 2013-02-12 MED ORDER — MORPHINE SULFATE 2 MG/ML IJ SOLN
1.0000 mg | INTRAMUSCULAR | Status: DC | PRN
  Administered 2013-02-13 – 2013-02-16 (×5): 2 mg via INTRAVENOUS
  Filled 2013-02-12 (×5): qty 1

## 2013-02-12 NOTE — Procedures (Signed)
Successful US guided paracentesis from LLQ.  Yielded 2.4 liters of bright yellow fluid.  No immediate complications.  Pt tolerated well.   Specimen was sent for labs.  Pattricia Boss D PA-C 02/12/2013 11:26 AM

## 2013-02-12 NOTE — Progress Notes (Signed)
CRITICAL VALUE ALERT  Critical value received:  Na 117 Date of notification:  02/12/2013  Time of notification:  0925  Critical value read back:yes  Nurse who received alert:  Jonetta Speak RN  MD notified (1st page):  Dr. Sharon Seller  Time of first page:  0925  MD notified (2nd page):  Time of second page:  Responding MD:  Dr. Shanda Bumps MD responded: [phone]

## 2013-02-12 NOTE — ED Provider Notes (Signed)
See prior note   Ward Givens, MD 02/12/13 1500

## 2013-02-12 NOTE — Progress Notes (Signed)
Have critical lab result of Na-118 and  Total bili-25.1, K. SchorrNP was notified with orders made.

## 2013-02-12 NOTE — Progress Notes (Signed)
Pt. Refused the rest of her am blood work per phlebotomy complained that she's been stuck multiple times. Kirtland Bouchard SchorrNP made aware.

## 2013-02-12 NOTE — Progress Notes (Signed)
[facility] Progress Note [place] TEAM 1 - Stepdown ICU Team   Teresa House XBJ:[id number] DOB: [birth year] DOA: 2013-02-13 PCP: No PCP Per Patient  Brief narrative: 47 y.o. female who presented to the ER for the second time within a week for worsening jaundice. Since the time of her first presentation she had also devloped abdominal discomfort with distention. She denied any nausea vomiting diarrhea fever chills.   In the ER the patient was found to have elevated LFTs with total bilirubin around 26.9. Sonogram of abdomen showed features concerning for cirrhosis of the liver and ascites. Labs also showed severe hyponatremia. Patient stated that she drank alcohol every day for last 20 years but quit drinking 2 weeks ago when she noticed the  jaundice. She had been slowly tapering off her alcohol intake over the last 2 months. Denied any chest pain or shortness of breath.  HPI/Subjective:  Assessment/Plan:  Newly diagnosed Hepatitis C + Alcoholic cirrhosis of liver -TB was 0.4 in 2010 -this admit mild elevation AST/ALT but marked elevation TB direct > indirect -ammonia, platelets and coags normal suggestive of acute hepatitis -Hep C positive on screen this admit -have asked GI to see to help determine if pt is candidate for tx   Ascites -post paracentesis 2.4 L in IR 12/10 -fluid yellow / cx and studies pending -had abd pain pre admit so treating empirically for SBP  Hyponatremia /  Hypokalemia -low sodium due to hypervolemia from cirrhosis - lasix diuresis and follow Na trend  -replete K  Metabolic acidosis -from hypotension and intravascular volume depletion as fluid has shifted intra-abdominally due to ascites  Tobacco abuse  UTI / Trichomonas infection  -FU cx -begin Flagyl for Trich  DVT prophylaxis: SCDs Code Status: Full Family Communication:  Disposition Plan/Expected LOS: SDU  Consultants: Gastroenterology  Procedures: Paracentesis by interventional  radiology  12/10  Antibiotics: Ceftriaxone 12/09 >>> 12/09 Cipro 12/09 >>> Flagyl 12/09 >>>  Objective: Blood pressure 108/70, pulse 83, temperature 97.8 F (36.6 C), temperature source Oral, resp. rate 18, height 5\' 6"  (1.676 m), weight 129 lb 13.6 oz (58.9 kg), SpO2 98.00%.  Intake/Output Summary (Last 24 hours) at 02/12/13 1412 Last data filed at February 13, 2013 2354  Gross per 24 hour  Intake      0 ml  Output    150 ml  Net   -150 ml   Exam: General: No acute respiratory distress Lungs: Clear to auscultation bilaterally without wheezes or crackles, RA Cardiovascular: Regular rate and rhythm without murmur gallop or rub normal S1 and S2, no peripheral edema or JVD Abdomen: mildly protuberant, soft, bowel sounds positive, no rebound, no appreciable mass Musculoskeletal: No significant cyanosis, clubbing of bilateral lower extremities Neurological: Alert and oriented x 3, moves all extremities x 4 without focal neurological deficits, CN 2-12 intact  Scheduled Meds:  Scheduled Meds: . ciprofloxacin  400 mg Intravenous Q12H  . folic acid  1 mg Oral Daily  . LORazepam  0-4 mg Oral Q6H   Followed by  . [START ON 02/14/2013] LORazepam  0-4 mg Oral Q12H  . metronidazole  500 mg Intravenous Q8H  . multivitamin with minerals  1 tablet Oral Daily  . sodium chloride  1,000 mL Intravenous Once  . sodium chloride  3 mL Intravenous Q12H  . sodium chloride  3 mL Intravenous Q12H  . thiamine  100 mg Oral Daily   Or  . thiamine  100 mg Intravenous Daily    Data Reviewed: Basic Metabolic Panel:  Recent Labs Lab 02/06/13 0956 02/18/2013 1851 02/12/13 0300 02/12/13 0800 02/12/13 1111  NA 127* 115* 118* 117* 118*  K 3.3* 3.5 2.8* 3.7 3.1*  CL 93* 83* 86* 87* 87*  CO2 22 18* 17* 15* 19  GLUCOSE 112* 116* 93 94 92  BUN 4* 14 14 15 16   CREATININE 0.39* 0.52 0.54 0.57 0.63  CALCIUM 8.5 7.7* 7.3* 7.5* 7.6*   Liver Function Tests:  Recent Labs Lab 02/06/13 0956 02-18-2013 1851  02/12/13 0300  AST 108* 144* 140*  ALT 53* 76* 72*  ALKPHOS 167* 181* 161*  BILITOT 18.2* 26.9* 25.1*  PROT 6.0 5.4* 5.0*  ALBUMIN 2.1* 1.9* 1.7*    Recent Labs Lab 02/06/13 0956 2013-02-18 1851  LIPASE 60* 38    Recent Labs Lab 2013/02/18 1950  AMMONIA 28   CBC:  Recent Labs Lab 02/06/13 0956 02-18-13 1851  WBC 10.9* 15.5*  NEUTROABS 8.5* 13.5*  HGB 10.8* 12.7  HCT 30.9* 35.2*  MCV 109.6* 107.6*  PLT 171 181    Recent Results (from the past 240 hour(s))  MRSA PCR SCREENING     Status: None   Collection Time    02-18-2013 11:17 PM      Result Value Range Status   MRSA by PCR NEGATIVE  NEGATIVE Final   Comment:            The GeneXpert MRSA Assay (FDA     approved for NASAL specimens     only), is one component of a     comprehensive MRSA colonization     surveillance program. It is not     intended to diagnose MRSA     infection nor to guide or     monitor treatment for     MRSA infections.  GRAM STAIN     Status: None   Collection Time    02/12/13 10:39 AM      Result Value Range Status   Specimen Description FLUID ASCITIC   Final   Special Requests   Final   Gram Stain     Final   Value: NO WBC SEEN     NO ORGANISMS SEEN     CALLED TO K MORGAN,PA 02/12/13 1305 BY K SCHULTZ   Report Status 02/12/2013 FINAL   Final     Studies:  Recent x-ray studies have been reviewed in detail by the Attending Physician     Junious Silk, ANP [facility] Office  [phone] Pager [phone]  **If unable to reach the above provider after paging please contact the Flow Manager @ [phone]  On-Call/Text Page:      [email or web address]      password [email or web address]  If 7PM-7AM, please contact night-coverage [email or web address] Password [email or web address] 02/12/2013, 2:12 PM   LOS: 1 day   I have personally examined this patient and reviewed the entire database. I have reviewed the above note, made any necessary editorial changes, and agree with its content.  Lonia Blood,  MD [facility]

## 2013-02-12 NOTE — Plan of Care (Cosign Needed)
UA reviewed- note positive for trichomonas- Flagy; IV started  Junious Silk, ANP

## 2013-02-12 NOTE — Progress Notes (Signed)
Utilization review completed. Jerri Glauser, RN, BSN. 

## 2013-02-12 NOTE — Consult Note (Signed)
Unassigned patient Reason for Consult: Severe jaundice. Referring Physician: THP  Teresa House is an 47 y.o. female.  HPI: 47 year old white female, admitted with severe jaundice, noted to have cirrhosis and ascites on ultrasound, had a paracentesis earlier today, found to have severe hyponatremia on admission-gives a history of drinking 3 forty ounces of beer per day for over 20 years along with tobacco use-1/2 ppd since the age of 48. Woke up one morning about 2 weeks ago, when she noticed a yellow tinge to her skin. She claims she stopped drinking alcohol 2 weeks ago but developed acute abdominal pain which brought her to the ER. Her abdominal pain seems to have improved after the paracentesis. She denies the use of street drugs or IVDA. She was also found to be positive for Hepatitis C and as per her husband she was positive for this in 2008, when they lived in [place] but then tested negative on 2 occasions. There is no family history of liver disease.     Past Medical History  Diagnosis Date  . Hypertension    Past Surgical History  Procedure Laterality Date  . Ankle surgry     Family History  Problem Relation Age of Onset  . Adopted: Yes   Social History:  reports that she has been smoking Cigarettes.  She has been smoking about 1/2  packs per day since age 68 . She does not have any smokeless tobacco history on file. She reports that she drinks alcohol. She reports that she does not use illicit drugs.  Allergies:  Allergies  Allergen Reactions  . Penicillins Other (See Comments)    Childhood reaction    Medications: I have reviewed the patient's current medications.  Results for orders placed during the hospital encounter of 2013-03-02 (from the past 48 hour(s))  CBC WITH DIFFERENTIAL     Status: Abnormal   Collection Time    03-02-13  6:51 PM      Result Value Range   WBC 15.5 (*) 4.0 - 10.5 K/uL   RBC 3.27 (*) 3.87 - 5.11 MIL/uL   Hemoglobin 12.7  12.0 - 15.0 g/dL    HCT 78.2 (*) 95.6 - 46.0 %   MCV 107.6 (*) 78.0 - 100.0 fL   MCH 38.8 (*) 26.0 - 34.0 pg   MCHC 36.1 (*) 30.0 - 36.0 g/dL   RDW 21.3 (*) 08.6 - 57.8 %   Platelets 181  150 - 400 K/uL   Neutrophils Relative % 87 (*) 43 - 77 %   Neutro Abs 13.5 (*) 1.7 - 7.7 K/uL   Lymphocytes Relative 5 (*) 12 - 46 %   Lymphs Abs 0.8  0.7 - 4.0 K/uL   Monocytes Relative 7  3 - 12 %   Monocytes Absolute 1.1 (*) 0.1 - 1.0 K/uL   Eosinophils Relative 0  0 - 5 %   Eosinophils Absolute 0.1  0.0 - 0.7 K/uL   Basophils Relative 0  0 - 1 %   Basophils Absolute 0.0  0.0 - 0.1 K/uL  COMPREHENSIVE METABOLIC PANEL     Status: Abnormal   Collection Time    2013-03-02  6:51 PM      Result Value Range   Sodium 115 (*) 135 - 145 mEq/L   Comment: CRITICAL RESULT CALLED TO, READ BACK BY AND VERIFIED WITH:     RN K. ALBRIGHT 2013-03-02 2014 KERAN M.   Potassium 3.5  3.5 - 5.1 mEq/L   Chloride 83 (*)  96 - 112 mEq/L   CO2 18 (*) 19 - 32 mEq/L   Glucose, Bld 116 (*) 70 - 99 mg/dL   BUN 14  6 - 23 mg/dL   Creatinine, Ser 1.61  0.50 - 1.10 mg/dL   Calcium 7.7 (*) 8.4 - 10.5 mg/dL   Total Protein 5.4 (*) 6.0 - 8.3 g/dL   Comment: ICTERUS AT THIS LEVEL MAY AFFECT RESULT   Albumin 1.9 (*) 3.5 - 5.2 g/dL   AST 096 (*) 0 - 37 U/L   ALT 76 (*) 0 - 35 U/L   Alkaline Phosphatase 181 (*) 39 - 117 U/L   Total Bilirubin 26.9 (*) 0.3 - 1.2 mg/dL   Comment: CRITICAL RESULT CALLED TO, READ BACK BY AND VERIFIED WITH:     RN K. ALBRIGHT 12-Mar-2013 2014 KERAN M.   GFR calc non Af Amer >90  >90 mL/min   GFR calc Af Amer >90  >90 mL/min   Comment: (NOTE)     The eGFR has been calculated using the CKD EPI equation.     This calculation has not been validated in all clinical situations.     eGFR's persistently <90 mL/min signify possible Chronic Kidney     Disease.  LIPASE, BLOOD     Status: None   Collection Time    Mar 12, 2013  6:51 PM      Result Value Range   Lipase 38  11 - 59 U/L  HEPATITIS PANEL, ACUTE     Status: Abnormal    Collection Time    03/12/2013  6:51 PM      Result Value Range   Hepatitis B Surface Ag NEGATIVE  NEGATIVE   HCV Ab Reactive (*) NEGATIVE   Comment: (NOTE)                                                                               This test is for screening purposes only.  Reactive results should be     confirmed by an alternative method.  Suggest HCV Qualitative, PCR,     test code 04540.  Specimens will be stable for reflex testing up to 3     days after collection.   Hep A IgM NON REACTIVE  NON REACTIVE   Hep B C IgM NON REACTIVE  NON REACTIVE   Comment: (NOTE)     High levels of Hepatitis B Core IgM antibody are detectable     during the acute stage of Hepatitis B. This antibody is used     to differentiate current from past HBV infection.     Performed at [facility]  ETHANOL     Status: None   Collection Time    12-Mar-2013  7:08 PM      Result Value Range   Alcohol, Ethyl (B) <11  0 - 11 mg/dL   Comment:            LOWEST DETECTABLE LIMIT FOR     SERUM ALCOHOL IS 11 mg/dL     FOR MEDICAL PURPOSES ONLY  AMMONIA     Status: None   Collection Time    2013-03-12  7:50 PM      Result  Value Range   Ammonia 28  11 - 60 umol/L   Comment: ICTERUS AT THIS LEVEL MAY AFFECT RESULT  PROTIME-INR     Status: Abnormal   Collection Time    2013-03-10  7:50 PM      Result Value Range   Prothrombin Time 17.3 (*) 11.6 - 15.2 seconds   INR 1.45  0.00 - 1.49  APTT     Status: None   Collection Time    March 10, 2013  7:50 PM      Result Value Range   aPTT 29  24 - 37 seconds  URINALYSIS W MICROSCOPIC + REFLEX CULTURE     Status: Abnormal   Collection Time    2013-03-10  8:02 PM      Result Value Range   Color, Urine ORANGE (*) YELLOW   Comment: BIOCHEMICALS MAY BE AFFECTED BY COLOR   APPearance CLOUDY (*) CLEAR   Specific Gravity, Urine 1.018  1.005 - 1.030   pH 5.5  5.0 - 8.0   Glucose, UA NEGATIVE  NEGATIVE mg/dL   Hgb urine dipstick NEGATIVE  NEGATIVE   Bilirubin Urine LARGE (*)  NEGATIVE   Ketones, ur 15 (*) NEGATIVE mg/dL   Protein, ur NEGATIVE  NEGATIVE mg/dL   Urobilinogen, UA 1.0  0.0 - 1.0 mg/dL   Nitrite POSITIVE (*) NEGATIVE   Leukocytes, UA LARGE (*) NEGATIVE   WBC, UA 21-50  <3 WBC/hpf   Bacteria, UA MANY (*) RARE   Squamous Epithelial / LPF MANY (*) RARE   Casts HYALINE CASTS (*) NEGATIVE   Urine-Other TRICHOMONAS PRESENT    POCT PREGNANCY, URINE     Status: None   Collection Time    03-10-13  8:15 PM      Result Value Range   Preg Test, Ur NEGATIVE  NEGATIVE   Comment:            THE SENSITIVITY OF THIS     METHODOLOGY IS >24 mIU/mL  MRSA PCR SCREENING     Status: None   Collection Time    Mar 10, 2013 11:17 PM      Result Value Range   MRSA by PCR NEGATIVE  NEGATIVE   Comment:            The GeneXpert MRSA Assay (FDA     approved for NASAL specimens     only), is one component of a     comprehensive MRSA colonization     surveillance program. It is not     intended to diagnose MRSA     infection nor to guide or     monitor treatment for     MRSA infections.  LACTIC ACID, PLASMA     Status: None   Collection Time    Mar 10, 2013 11:59 PM      Result Value Range   Lactic Acid, Venous 1.5  0.5 - 2.2 mmol/L  SODIUM, URINE, RANDOM     Status: None   Collection Time    02/12/13 12:23 AM      Result Value Range   Sodium, Ur <10    OSMOLALITY, URINE     Status: Abnormal   Collection Time    02/12/13 12:23 AM      Result Value Range   Osmolality, Ur 257 (*) 390 - 1090 mOsm/kg   Comment: Performed at [facility]  HEPATIC FUNCTION PANEL     Status: Abnormal   Collection Time    02/12/13  3:00 AM  Result Value Range   Total Protein 5.0 (*) 6.0 - 8.3 g/dL   Comment: ICTERUS AT THIS LEVEL MAY AFFECT RESULT   Albumin 1.7 (*) 3.5 - 5.2 g/dL   AST 161 (*) 0 - 37 U/L   ALT 72 (*) 0 - 35 U/L   Alkaline Phosphatase 161 (*) 39 - 117 U/L   Total Bilirubin 25.1 (*) 0.3 - 1.2 mg/dL   Comment: CRITICAL RESULT CALLED TO, READ BACK BY AND  VERIFIED WITH:     ZAMORA,E RN 02/12/2013 0615 JORDANS     REPEATED TO VERIFY   Bilirubin, Direct 17.5 (*) 0.0 - 0.3 mg/dL   Comment: RESULTS CONFIRMED BY MANUAL DILUTION   Indirect Bilirubin 7.6 (*) 0.3 - 0.9 mg/dL  BASIC METABOLIC PANEL     Status: Abnormal   Collection Time    02/12/13  3:00 AM      Result Value Range   Sodium 118 (*) 135 - 145 mEq/L   Comment: CRITICAL RESULT CALLED TO, READ BACK BY AND VERIFIED WITH:     ZAMORA,E RN 02/12/2013 0615 JORDANS     REPEATED TO VERIFY   Potassium 2.8 (*) 3.5 - 5.1 mEq/L   Comment: DELTA CHECK NOTED   Chloride 86 (*) 96 - 112 mEq/L   CO2 17 (*) 19 - 32 mEq/L   Glucose, Bld 93  70 - 99 mg/dL   BUN 14  6 - 23 mg/dL   Creatinine, Ser 0.96  0.50 - 1.10 mg/dL   Comment: ICTERUS AT THIS LEVEL MAY AFFECT RESULT   Calcium 7.3 (*) 8.4 - 10.5 mg/dL   GFR calc non Af Amer >90  >90 mL/min   GFR calc Af Amer >90  >90 mL/min   Comment: (NOTE)     The eGFR has been calculated using the CKD EPI equation.     This calculation has not been validated in all clinical situations.     eGFR's persistently <90 mL/min signify possible Chronic Kidney     Disease.  ACETAMINOPHEN LEVEL     Status: Abnormal   Collection Time    02/12/13  3:00 AM      Result Value Range   Acetaminophen (Tylenol), Serum <10.0 (*) 10 - 30 ug/mL   Comment: PERFORMED AT [facility]     NORMAL RANGE (10 TO 20 UG/ML)  BASIC METABOLIC PANEL     Status: Abnormal   Collection Time    02/12/13  8:00 AM      Result Value Range   Sodium 117 (*) 135 - 145 mEq/L   Comment: CRITICAL RESULT CALLED TO, READ BACK BY AND VERIFIED WITH:     BETHEL,E RN @ [phone] 02/12/13 LEONARD,A   Potassium 3.7  3.5 - 5.1 mEq/L   Chloride 87 (*) 96 - 112 mEq/L   CO2 15 (*) 19 - 32 mEq/L   Glucose, Bld 94  70 - 99 mg/dL   BUN 15  6 - 23 mg/dL   Creatinine, Ser 0.98  0.50 - 1.10 mg/dL   Comment: ICTERUS AT THIS LEVEL MAY AFFECT RESULT   Calcium 7.5 (*) 8.4 - 10.5 mg/dL   GFR calc non Af Amer  >90  >90 mL/min   GFR calc Af Amer >90  >90 mL/min   Comment: (NOTE)     The eGFR has been calculated using the CKD EPI equation.     This calculation has not been validated in all clinical situations.     eGFR's persistently <90  mL/min signify possible Chronic Kidney     Disease.  SALICYLATE LEVEL     Status: Abnormal   Collection Time    02/12/13  8:00 AM      Result Value Range   Salicylate Lvl <2.0 (*) 2.8 - 20.0 mg/dL  HIV ANTIBODY (ROUTINE TESTING)     Status: None   Collection Time    02/12/13 10:30 AM      Result Value Range   HIV NON REACTIVE  NON REACTIVE   Comment: Performed at [facility]  ALBUMIN, FLUID     Status: None   Collection Time    02/12/13 10:39 AM      Result Value Range   Albumin, Fluid 0.4     Comment: NO NORMAL RANGE ESTABLISHED FOR THIS TEST   Fluid Type-FALB ASCITIC     Comment: ABDOMEN     FLUID     CORRECTED ON 12/10 AT 1222: PREVIOUSLY REPORTED AS Body Fluid  BODY FLUID CELL COUNT WITH DIFFERENTIAL     Status: Abnormal   Collection Time    02/12/13 10:39 AM      Result Value Range   Fluid Type-FCT ASCITIC     Comment: ABDOMEN     FLUID     CORRECTED ON 12/10 AT 1223: PREVIOUSLY REPORTED AS Body Fluid   Color, Fluid YELLOW (*) YELLOW   Appearance, Fluid CLEAR  CLEAR   WBC, Fluid 55  0 - 1000 cu mm   Neutrophil Count, Fluid 6  0 - 25 %   Lymphs, Fluid 14     Monocyte-Macrophage-Serous Fluid 80  50 - 90 %   Eos, Fluid 0    GRAM STAIN     Status: None   Collection Time    02/12/13 10:39 AM      Result Value Range   Specimen Description FLUID ASCITIC     Special Requests     Gram Stain       Value: NO WBC SEEN     NO ORGANISMS SEEN     CALLED TO K MORGAN,PA 02/12/13 1305 BY K SCHULTZ   Report Status 02/12/2013 FINAL    BASIC METABOLIC PANEL     Status: Abnormal   Collection Time    02/12/13 11:11 AM      Result Value Range   Sodium 118 (*) 135 - 145 mEq/L   Comment: CRITICAL RESULT CALLED TO, READ BACK BY AND VERIFIED  WITH:     E BETHEL,RN 1232 02/12/13 WBOND   Potassium 3.1 (*) 3.5 - 5.1 mEq/L   Chloride 87 (*) 96 - 112 mEq/L   CO2 19  19 - 32 mEq/L   Glucose, Bld 92  70 - 99 mg/dL   BUN 16  6 - 23 mg/dL   Creatinine, Ser 4.78  0.50 - 1.10 mg/dL   Comment: ICTERUS AT THIS LEVEL MAY AFFECT RESULT   Calcium 7.6 (*) 8.4 - 10.5 mg/dL   GFR calc non Af Amer >90  >90 mL/min   GFR calc Af Amer >90  >90 mL/min   Comment: (NOTE)     The eGFR has been calculated using the CKD EPI equation.     This calculation has not been validated in all clinical situations.     eGFR's persistently <90 mL/min signify possible Chronic Kidney     Disease.  BASIC METABOLIC PANEL     Status: Abnormal   Collection Time    02/12/13  4:55 PM  Result Value Range   Sodium 118 (*) 135 - 145 mEq/L   Comment: CRITICAL RESULT CALLED TO, READ BACK BY AND VERIFIED WITH:     E BETHEL,RN 1750 02/12/13 D BRADLEY   Potassium 3.6  3.5 - 5.1 mEq/L   Chloride 87 (*) 96 - 112 mEq/L   CO2 18 (*) 19 - 32 mEq/L   Glucose, Bld 94  70 - 99 mg/dL   BUN 17  6 - 23 mg/dL   Creatinine, Ser 4.09  0.50 - 1.10 mg/dL   Comment: ICTERUS AT THIS LEVEL MAY AFFECT RESULT   Calcium 7.5 (*) 8.4 - 10.5 mg/dL   GFR calc non Af Amer >90  >90 mL/min   GFR calc Af Amer >90  >90 mL/min   Comment: (NOTE)     The eGFR has been calculated using the CKD EPI equation.     This calculation has not been validated in all clinical situations.     eGFR's persistently <90 mL/min signify possible Chronic Kidney     Disease.   Nm Hepatobiliary Liver Func  02/12/2013   CLINICAL DATA:  Nausea. Elevated bilirubin. Abdominal pain and jaundice.  EXAM: NUCLEAR MEDICINE HEPATOBILIARY IMAGING  TECHNIQUE: Sequential images of the abdomen were obtained out to 60 minutes following intravenous administration of radiopharmaceutical.  COMPARISON:  None.  RADIOPHARMACEUTICALS:  Tc-39m Choletec  FINDINGS: There was no excretion of contrast into the biliary tree at 2 hr. I  suspect the patient has severe hepatocellular disease.  IMPRESSION: Indeterminate hepatobiliary scan with no visualization of the biliary tree secondary to severe hepatocellular disease.   Electronically Signed   By: Geanie Cooley M.D.   On: 02/12/2013 16:09   US Abdomen Complete  02/13/2013   CLINICAL DATA:  Abdominal pain.  History of hypertension.  EXAM: ULTRASOUND ABDOMEN COMPLETE  COMPARISON:  None.  FINDINGS: Gallbladder:  The gallbladder wall is thickened at 6.6 mm. Within the gallbladder, there is significant layering sludge. No definite stones are identified.  Common bile duct:  Diameter: 8.2 mm without definite stones.  Liver:  Enlarged and echogenic. Question nodular surface. Perihepatic fluid.  IVC:  No abnormality visualized.  Pancreas:  Visualized portion has a normal appearance. There is poor visualization because of overlying bowel gas however.  Spleen:  Normal in appearance, 11.0 cm.  Right Kidney:  Length: 10.7 cm. Echogenicity within normal limits. No mass or hydronephrosis visualized.  Left Kidney:  Length: 12.7 cm. Echogenicity within normal limits. No mass or hydronephrosis visualized.  Abdominal aorta:  No aneurysm visualized.  Other findings:  Note is made of dampened flow within the main portal vein, noted to be approximately 11 centimeters/second. Findings raise the question portal venous hypertension. Note is made of ascites. .  IMPRESSION: 1. Nodular contour of the liver, raising the question of cirrhosis. 2. No focal liver lesion identified. 3. Ascites. 4. Question of portal venous hypertension.  See above. 5. Normal appearance of the kidneys. 6. Thickened gallbladder wall with significant sludge.   Electronically Signed   By: Rosalie Gums M.D.   On: 02/23/2013 22:16   US Paracentesis  02/12/2013   CLINICAL DATA:  Jaundice, cirrhosis, recurrent ascites  EXAM: ULTRASOUND GUIDED PARACENTESIS  TECHNIQUE: Survey ultrasound of the abdomen was performed and an appropriate skin entry site  in the LLQ abdomen was selected. Skin site was marked, prepped with Betadine, and draped in usual sterile fashion, and infiltrated locally with 1% lidocaine. A 5 French multisidehole Yueh sheath needle was advanced  into the peritoneal space until fluid could be aspirated. The sheath was advanced and the needle removed. 2.4 liters of bright yellow ascites were aspirated. No immediate complication. The fluid was sent for testing.  IMPRESSION: Technically successful ultrasound guided paracentesis, removing 2.4 liters of ascites.  Read By:  Pattricia Boss PA-C   Electronically Signed   By: Irish Lack M.D.   On: 02/12/2013 11:30   Dg Chest Port 1 View  02/21/2013   CLINICAL DATA:  Check for infiltrates.  EXAM: PORTABLE CHEST - 1 VIEW  COMPARISON:  07/15/2008  FINDINGS: Shallow inflation. Heart size is normal. There are no focal consolidations. Suspect minimal left lower lobe atelectasis. No edema.  IMPRESSION: 1. Shallow inflation. 2. Left lower lobe atelectasis.   Electronically Signed   By: Rosalie Gums M.D.   On: 03/04/2013 23:37   Review of Systems  Constitutional: Positive for malaise/fatigue. Negative for fever, chills, weight loss and diaphoresis.  HENT: Negative.   Eyes: Negative.   Respiratory: Positive for cough, shortness of breath and wheezing. Negative for hemoptysis and sputum production.   Cardiovascular: Negative.   Gastrointestinal: Positive for abdominal pain. Negative for nausea, vomiting, diarrhea, constipation and blood in stool.  Genitourinary: Negative.   Skin: Negative.   Neurological: Positive for weakness.  Endo/Heme/Allergies: Bruises/bleeds easily.  Psychiatric/Behavioral: The patient is nervous/anxious.    Blood pressure 91/69, pulse 103, temperature 98.1 F (36.7 C), temperature source Oral, resp. rate 15, height 5\' 6"  (1.676 m), weight 58.9 kg (129 lb 13.6 oz), SpO2 100.00%. Physical Exam  Constitutional: She is oriented to person, place, and time. Vital signs are  normal. She appears cachectic. She is cooperative. She has a sickly appearance. No distress.  Emaciated, severely jaundiced white female  HENT:  Head: Normocephalic and atraumatic.  Eyes:  Jaundiced sclera  Neck: Trachea normal and normal range of motion. Neck supple. No mass and no thyromegaly present.  Cardiovascular: Normal rate and regular rhythm.   Respiratory: No respiratory distress. She has decreased breath sounds in the right lower field.  Coarse rhonch present in right lung base  GI: Soft. Normal appearance. She exhibits no shifting dullness, no pulsatile liver and no abdominal bruit. There is no hepatomegaly. There is no tenderness. There is no rebound and no CVA tenderness.  Musculoskeletal:  Generalized wasting noted  Neurological: She is alert and oriented to person, place, and time.  asterixis present  Skin: Skin is warm and dry. Petechiae noted.  Multiple spider angiomas on the chest  Psychiatric: Her speech is normal. Judgment and thought content normal. Her mood appears anxious. She is agitated. Cognition and memory are normal.   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 As per my discussion with Dr. Sharon Seller, we need to check her HCV RNA levels to evaluate her viral load. Dr. Elnoria Howard to see tomorrow.  2) Severe hyponatremia: Agree with fluid restriction.  3) Severe malnutrition with an albumin of 1.7. 4) Trichomonas vaginitis-on Flagyl. 5) Tobacco abuse.   Karolyne Timmons 02/12/2013, 6:00 PM

## 2013-02-13 LAB — COMPREHENSIVE METABOLIC PANEL
ALT: 95 U/L — ABNORMAL HIGH (ref 0–35)
AST: 188 U/L — ABNORMAL HIGH (ref 0–37)
Albumin: 1.7 g/dL — ABNORMAL LOW (ref 3.5–5.2)
Alkaline Phosphatase: 186 U/L — ABNORMAL HIGH (ref 39–117)
BUN: 20 mg/dL (ref 6–23)
Creatinine, Ser: 0.82 mg/dL (ref 0.50–1.10)
Potassium: 3.9 mEq/L (ref 3.5–5.1)
Total Bilirubin: 25.5 mg/dL (ref 0.3–1.2)
Total Protein: 5 g/dL — ABNORMAL LOW (ref 6.0–8.3)

## 2013-02-13 LAB — URINE CULTURE: Colony Count: >=100000

## 2013-02-13 LAB — PROTIME-INR
INR: 1.7 — ABNORMAL HIGH (ref 0.00–1.49)
Prothrombin Time: 19.5 seconds — ABNORMAL HIGH (ref 11.6–15.2)

## 2013-02-13 MED ORDER — INFLUENZA VAC SPLIT QUAD 0.5 ML IM SUSP
0.5000 mL | INTRAMUSCULAR | Status: DC
  Filled 2013-02-13: qty 0.5

## 2013-02-13 MED ORDER — PNEUMOCOCCAL VAC POLYVALENT 25 MCG/0.5ML IJ INJ
0.5000 mL | INJECTION | INTRAMUSCULAR | Status: DC
  Filled 2013-02-13: qty 0.5

## 2013-02-13 NOTE — Progress Notes (Signed)
Subjective: No new complaints.  Some right sided discomfort.  No problems with breathing.  Objective: Vital signs in last 24 hours: Temp:  [97.8 F (36.6 C)-98.3 F (36.8 C)] 98.3 F (36.8 C) (12/11 1200) Pulse Rate:  [79-103] 79 (12/11 1200) Resp:  [9-21] 21 (12/11 1200) BP: (90-99)/(51-69) 90/51 mmHg (12/11 1200) SpO2:  [98 %-100 %] 99 % (12/11 1200) Weight:  [127 lb 13.9 oz (58 kg)] 127 lb 13.9 oz (58 kg) (12/11 0500) Last BM Date: 02/12/13  Intake/Output from previous day:   Intake/Output this shift: Total I/O In: -  Out: 200 [Urine:200]  General appearance: alert and no distress Resp: clear to auscultation bilaterally Cardio: regular rate and rhythm GI: distended with ascites, soft Extremities: negative for asterixis  Lab Results:  Recent Labs  Feb 25, 2013 1851  WBC 15.5*  HGB 12.7  HCT 35.2*  PLT 181   BMET  Recent Labs  02/12/13 1111 02/12/13 1655 02/13/13 0605  NA 118* 118* 118*  K 3.1* 3.6 3.9  CL 87* 87* 89*  CO2 19 18* 18*  GLUCOSE 92 94 95  BUN 16 17 20   CREATININE 0.63 0.66 0.82  CALCIUM 7.6* 7.5* 7.4*   LFT  Recent Labs  02/12/13 0300 02/13/13 0605  PROT 5.0* 5.0*  ALBUMIN 1.7* 1.7*  AST 140* 188*  ALT 72* 95*  ALKPHOS 161* 186*  BILITOT 25.1* 25.5*  BILIDIR 17.5*  --   IBILI 7.6*  --    PT/INR  Recent Labs  02-25-13 1950 02/13/13 0605  LABPROT 17.3* 19.5*  INR 1.45 1.70*   Hepatitis Panel  Recent Labs  February 25, 2013 1851  HEPBSAG NEGATIVE  HCVAB Reactive*  HEPAIGM NON REACTIVE  HEPBIGM NON REACTIVE   C-Diff No results found for this basename: CDIFFTOX,  in the last 72 hours Fecal Lactopherrin No results found for this basename: FECLLACTOFRN,  in the last 72 hours  Studies/Results: Nm Hepatobiliary Liver Func  02/12/2013   CLINICAL DATA:  Nausea. Elevated bilirubin. Abdominal pain and jaundice.  EXAM: NUCLEAR MEDICINE HEPATOBILIARY IMAGING  TECHNIQUE: Sequential images of the abdomen were obtained out to 60 minutes  following intravenous administration of radiopharmaceutical.  COMPARISON:  None.  RADIOPHARMACEUTICALS:  Tc-11m Choletec  FINDINGS: There was no excretion of contrast into the biliary tree at 2 hr. I suspect the patient has severe hepatocellular disease.  IMPRESSION: Indeterminate hepatobiliary scan with no visualization of the biliary tree secondary to severe hepatocellular disease.   Electronically Signed   By: Geanie Cooley M.D.   On: 02/12/2013 16:09   US Abdomen Complete  02-25-2013   CLINICAL DATA:  Abdominal pain.  History of hypertension.  EXAM: ULTRASOUND ABDOMEN COMPLETE  COMPARISON:  None.  FINDINGS: Gallbladder:  The gallbladder wall is thickened at 6.6 mm. Within the gallbladder, there is significant layering sludge. No definite stones are identified.  Common bile duct:  Diameter: 8.2 mm without definite stones.  Liver:  Enlarged and echogenic. Question nodular surface. Perihepatic fluid.  IVC:  No abnormality visualized.  Pancreas:  Visualized portion has a normal appearance. There is poor visualization because of overlying bowel gas however.  Spleen:  Normal in appearance, 11.0 cm.  Right Kidney:  Length: 10.7 cm. Echogenicity within normal limits. No mass or hydronephrosis visualized.  Left Kidney:  Length: 12.7 cm. Echogenicity within normal limits. No mass or hydronephrosis visualized.  Abdominal aorta:  No aneurysm visualized.  Other findings:  Note is made of dampened flow within the main portal vein, noted to be approximately  11 centimeters/second. Findings raise the question portal venous hypertension. Note is made of ascites. .  IMPRESSION: 1. Nodular contour of the liver, raising the question of cirrhosis. 2. No focal liver lesion identified. 3. Ascites. 4. Question of portal venous hypertension.  See above. 5. Normal appearance of the kidneys. 6. Thickened gallbladder wall with significant sludge.   Electronically Signed   By: Rosalie Gums M.D.   On: 02/19/2013 22:16   US  Paracentesis  02/12/2013   CLINICAL DATA:  Jaundice, cirrhosis, recurrent ascites  EXAM: ULTRASOUND GUIDED PARACENTESIS  TECHNIQUE: Survey ultrasound of the abdomen was performed and an appropriate skin entry site in the LLQ abdomen was selected. Skin site was marked, prepped with Betadine, and draped in usual sterile fashion, and infiltrated locally with 1% lidocaine. A 5 French multisidehole Yueh sheath needle was advanced into the peritoneal space until fluid could be aspirated. The sheath was advanced and the needle removed. 2.4 liters of bright yellow ascites were aspirated. No immediate complication. The fluid was sent for testing.  IMPRESSION: Technically successful ultrasound guided paracentesis, removing 2.4 liters of ascites.  Read By:  Pattricia Boss PA-C   Electronically Signed   By: Irish Lack M.D.   On: 02/12/2013 11:30   Dg Chest Port 1 View  02/27/2013   CLINICAL DATA:  Check for infiltrates.  EXAM: PORTABLE CHEST - 1 VIEW  COMPARISON:  07/15/2008  FINDINGS: Shallow inflation. Heart size is normal. There are no focal consolidations. Suspect minimal left lower lobe atelectasis. No edema.  IMPRESSION: 1. Shallow inflation. 2. Left lower lobe atelectasis.   Electronically Signed   By: Rosalie Gums M.D.   On: 02/25/2013 23:37    Medications:  Scheduled: . ciprofloxacin  400 mg Intravenous Q12H  . folic acid  1 mg Oral Daily  . furosemide  20 mg Intravenous BID  . LORazepam  0-4 mg Oral Q6H   Followed by  . [START ON 02/14/2013] LORazepam  0-4 mg Oral Q12H  . metronidazole  500 mg Intravenous Q8H  . multivitamin with minerals  1 tablet Oral Daily  . sodium chloride  3 mL Intravenous Q12H  . spironolactone  12.5 mg Oral Daily  . thiamine  100 mg Oral Daily   Or  . thiamine  100 mg Intravenous Daily   Continuous:   Assessment/Plan: 1) Cirrhotic decompensation. 2) ETOH abuse. 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 4) Hyponatremia.   The etiology of her hepatic decompensation is difficult to  discern.  It is most likely a combination of issues.  I discussed with her the possibility of treatment with prednisone for ETOH hepatitis, but I cannot be certain that she has cirrhotic decompensation without ETOH hepatitis.  HCV also plays a role in this issue, however, the RNA is still pending to definitively diagnose the disease.  With her diarrhea and elevated WBC, I do not recommend any steroid Rx.  The best course of action for her is to remain off of ETOH and obtain good nutrition.  Overall, her prognosis is extremely poor.  Patient's who have hyponatremia have a high mortality rate within the next 1-2 years.  Plan: 1) Supportive care. 2) Okay with Metronidazole.   LOS: 2 days   Indea Dearman D 02/13/2013, 4:22 PM

## 2013-02-13 NOTE — Progress Notes (Signed)
[facility] Progress Note [place] TEAM 1 - Stepdown ICU Team   Teresa House ZOX:[id number] DOB: 1965-10-28 DOA: 25-Feb-2013 PCP: No PCP Per Patient  Brief narrative: 47 y.o. female who presented to the ER for the second time within a week for worsening jaundice. Since the time of her first presentation she had also devloped abdominal discomfort with distention. She denied any nausea vomiting diarrhea fever chills.   In the ER the patient was found to have elevated LFTs with total bilirubin around 26.9. Sonogram of abdomen showed features concerning for cirrhosis of the liver and ascites. Labs also showed severe hyponatremia. Patient stated that she drank alcohol every day for last 20 years but quit drinking 2 weeks ago when she noticed the  jaundice. She had been slowly tapering off her alcohol intake over the last 2 months. Denied any chest pain or shortness of breath.  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  -Hep C positive on screen this admit -pt confirm positive since 2008-Hep C RNA for viral load pending -GI following-  Ascites -post paracentesis 2.4 L in IR 12/10 -fluid yellow / cx and studies pending-NGTD -had abd pain pre admit so treating empirically for SBP -1000 cc FR  Hyponatremia /  Hypokalemia -low sodium due to hypervolemia from cirrhosis - lasix diuresis and follow Na trend  -replete K  Metabolic acidosis -from hypotension and intravascular volume depletion as fluid has shifted intra-abdominally due to ascites -resolved  Tobacco abuse  UTI / Trichomonas infection  -FU cx -cont empiric anbx's -begin Flagyl for Trich  DVT prophylaxis: SCDs Code  Status: Full Family Communication:  Disposition Plan/Expected LOS: Transfer to Floor  Consultants: Gastroenterology  Procedures: Paracentesis by interventional radiology  12/10  Antibiotics: Ceftriaxone 12/09 >>> 12/09 Cipro 12/09 >>> Flagyl 12/09 >>>  Objective: Blood pressure 96/61, pulse 94, temperature 98 F (36.7 C), temperature source Oral, resp. rate 15, height 5\' 6"  (1.676 m), weight 127 lb 13.9 oz (58 kg), SpO2 98.00%.  Intake/Output Summary (Last 24 hours) at 02/13/13 1100 Last data filed at 02/13/13 0916  Gross per 24 hour  Intake      0 ml  Output    200 ml  Net   -200 ml   Exam: General: No acute respiratory distress-quite jaundiced Lungs: Clear to auscultation bilaterally without wheezes or crackles, RA Cardiovascular: Regular rate and rhythm without murmur gallop or rub normal S1 and S2, no peripheral edema or JVD Abdomen: mildly protuberant, soft, bowel sounds positive, no rebound, no appreciable mass Musculoskeletal: No significant cyanosis, clubbing of bilateral lower extremities Neurological: Alert and oriented x 3, moves all extremities x 4 without focal neurological deficits, CN 2-12 intact-self ambulatory  Scheduled Meds:  Scheduled Meds: . ciprofloxacin  400 mg Intravenous Q12H  . folic acid  1 mg Oral Daily  . furosemide  20 mg Intravenous BID  . LORazepam  0-4 mg Oral Q6H   Followed by  . [START ON 02/14/2013] LORazepam  0-4 mg Oral Q12H  . metronidazole  500 mg Intravenous Q8H  . multivitamin with minerals  1 tablet Oral Daily  . sodium chloride  3 mL Intravenous Q12H  . spironolactone  12.5 mg Oral Daily  . thiamine  100 mg Oral Daily  Or  . thiamine  100 mg Intravenous Daily    Data Reviewed: Basic Metabolic Panel:  Recent Labs Lab 02/12/13 0300 02/12/13 0800 02/12/13 1111 02/12/13 1655 02/13/13 0605  NA 118* 117* 118* 118* 118*  K 2.8* 3.7 3.1* 3.6 3.9  CL 86* 87* 87* 87* 89*  CO2 17* 15* 19 18* 18*  GLUCOSE 93 94 92 94 95   BUN 14 15 16 17 20   CREATININE 0.54 0.57 0.63 0.66 0.82  CALCIUM 7.3* 7.5* 7.6* 7.5* 7.4*   Liver Function Tests:  Recent Labs Lab February 25, 2013 1851 02/12/13 0300 02/13/13 0605  AST 144* 140* 188*  ALT 76* 72* 95*  ALKPHOS 181* 161* 186*  BILITOT 26.9* 25.1* 25.5*  PROT 5.4* 5.0* 5.0*  ALBUMIN 1.9* 1.7* 1.7*    Recent Labs Lab 2013-02-25 1851  LIPASE 38    Recent Labs Lab 02-25-2013 1950  AMMONIA 28   CBC:  Recent Labs Lab 2013/02/25 1851  WBC 15.5*  NEUTROABS 13.5*  HGB 12.7  HCT 35.2*  MCV 107.6*  PLT 181    Recent Results (from the past 240 hour(s))  MRSA PCR SCREENING     Status: None   Collection Time    25-Feb-2013 11:17 PM      Result Value Range Status   MRSA by PCR NEGATIVE  NEGATIVE Final   Comment:            The GeneXpert MRSA Assay (FDA     approved for NASAL specimens     only), is one component of a     comprehensive MRSA colonization     surveillance program. It is not     intended to diagnose MRSA     infection nor to guide or     monitor treatment for     MRSA infections.  GRAM STAIN     Status: None   Collection Time    02/12/13 10:39 AM      Result Value Range Status   Specimen Description FLUID ASCITIC   Final   Special Requests   Final   Gram Stain     Final   Value: NO WBC SEEN     NO ORGANISMS SEEN     CALLED TO K MORGAN,PA 02/12/13 1305 BY K SCHULTZ   Report Status 02/12/2013 FINAL   Final     Studies:  Recent x-ray studies have been reviewed in detail by the Attending Physician     Junious Silk, ANP [facility] Office  [phone] Pager [phone]  **If unable to reach the above provider after paging please contact the Flow Manager @ [phone]  On-Call/Text Page:      [email or web address]      password [email or web address]  If 7PM-7AM, please contact night-coverage [email or web address] Password [email or web address] 02/13/2013, 11:00 AM   LOS: 2 days    I have examined the patient, reviewed the chart and modified the above note which I agree  with.   Clinton Wahlberg,MD [phone] 02/13/2013, 1:55 PM

## 2013-02-13 NOTE — Progress Notes (Signed)
CRITICAL VALUE ALERT  Critical value received:  Na 118 total bili 25.5 Date of notification: 02/13/13  Time of notification:  0800  Critical value read back:yes  Nurse who received alert:  Jonetta Speak RN  MD notified (1st page):  Junious Silk NP  Time of first page:  0815  MD notified (2nd page):  Time of second page:  Responding MD:  Junious Silk NP Time MD responded: [phone]

## 2013-02-14 ENCOUNTER — Ambulatory Visit: Payer: Self-pay

## 2013-02-14 LAB — HEPATITIS C VRS RNA DETECT BY PCR-QUAL: Hepatitis C Vrs RNA by PCR-Qual: NEGATIVE

## 2013-02-14 MED ORDER — FUROSEMIDE 10 MG/ML IJ SOLN
INTRAMUSCULAR | Status: AC
  Filled 2013-02-14: qty 4

## 2013-02-14 NOTE — Progress Notes (Signed)
[facility] Progress Note [place] TEAM 1 - Stepdown ICU Team   Teresa House ZOX:[id number] DOB: [birth year] DOA: 02/13/2013 PCP: No PCP Per Patient  Brief narrative: 47 y.o. female who presented to the ER for the second time within a week for worsening jaundice. Since the time of her first presentation she had also devloped abdominal discomfort with distention. She denied any nausea vomiting diarrhea fever chills.   In the ER the patient was found to have elevated LFTs with total bilirubin around 26.9. Sonogram of abdomen showed features concerning for cirrhosis of the liver and ascites. Labs also showed severe hyponatremia. Patient stated that she drank alcohol every day for last 20 years but quit drinking 2 weeks ago when she noticed the  jaundice. She had been slowly tapering off her alcohol intake over the last 2 months. Denied any chest pain or shortness of breath.  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 -TB was 0.4 in 2010 -this admit mild elevation AST/ALT but marked elevation TB direct > indirect -ammonia, platelets and coags normal suggestive of acute hepatitis -Hep C positive on screen this admit -pt confirm positive since 2008-Hep C RNA for viral load pending -GI following  Ascites -post paracentesis 2.4 L in IR 12/10 -fluid yellow / cx and studies pending-NGTD -had abd pain pre admit so treating empirically for SBP -1000 cc FR -cont IV Lasix  Hyponatremia /  Hypokalemia -low sodium due to hypervolemia from cirrhosis - lasix diuresis and follow Na trend  -replete K  Metabolic acidosis -from hypotension and intravascular volume depletion as fluid has shifted intra-abdominally due to ascites -resolved  Tobacco abuse  UTI / Trichomonas infection  -FU cx -cont empiric anbx's -cont Flagyl for Trich  DVT prophylaxis: SCDs Code Status: Full Family Communication: No  family present at time of exam today Disposition Plan/Expected LOS:  Floor-eventual dc home noting has poor 1 year prognosis given presentation with hyponatremia esp if Hep C not active  Consultants: Gastroenterology  Procedures: Paracentesis by interventional radiology  12/10  Antibiotics: Ceftriaxone 12/09 >>> 12/09 Cipro 12/09 >>> Flagyl 12/09 >>>  Objective: Blood pressure 103/73, pulse 82, temperature 97.6 F (36.4 C), temperature source Oral, resp. rate 18, height 5\' 6"  (1.676 m), weight 128 lb 12.8 oz (58.423 kg), SpO2 99.00%.  Intake/Output Summary (Last 24 hours) at 02/14/13 1402 Last data filed at 02/14/13 1000  Gross per 24 hour  Intake   1020 ml  Output      0 ml  Net   1020 ml   Exam: General: No acute respiratory distress-remains jaundiced Lungs: Clear to auscultation bilaterally without wheezes or crackles, RA Cardiovascular: Regular rate and rhythm without murmur gallop or rub normal S1 and S2, no peripheral edema or JVD Abdomen: mildly protuberant, soft, bowel sounds positive, no rebound, no appreciable mass Musculoskeletal: No significant cyanosis, clubbing of bilateral lower extremities Neurological: Alert and oriented x 3, moves all extremities x 4 without focal neurological deficits, CN 2-12 intact-self ambulatory  Scheduled Meds:  Scheduled Meds: . ciprofloxacin  400 mg Intravenous Q12H  . folic acid  1 mg Oral Daily  . furosemide  20 mg Intravenous BID  . influenza vac split quadrivalent PF  0.5 mL Intramuscular Tomorrow-1000  . LORazepam  0-4 mg Oral Q12H  . metronidazole  500 mg Intravenous Q8H  . multivitamin with minerals  1 tablet Oral Daily  . pneumococcal 23 valent vaccine  0.5 mL  Intramuscular Tomorrow-1000  . sodium chloride  3 mL Intravenous Q12H  . spironolactone  12.5 mg Oral Daily  . thiamine  100 mg Oral Daily   Or  . thiamine  100 mg Intravenous Daily    Data Reviewed: Basic Metabolic Panel:  Recent Labs Lab 02/12/13 0300  02/12/13 0800 02/12/13 1111 02/12/13 1655 02/13/13 0605  NA 118* 117* 118* 118* 118*  K 2.8* 3.7 3.1* 3.6 3.9  CL 86* 87* 87* 87* 89*  CO2 17* 15* 19 18* 18*  GLUCOSE 93 94 92 94 95  BUN 14 15 16 17 20   CREATININE 0.54 0.57 0.63 0.66 0.82  CALCIUM 7.3* 7.5* 7.6* 7.5* 7.4*   Liver Function Tests:  Recent Labs Lab 03/06/2013 1851 02/12/13 0300 02/13/13 0605  AST 144* 140* 188*  ALT 76* 72* 95*  ALKPHOS 181* 161* 186*  BILITOT 26.9* 25.1* 25.5*  PROT 5.4* 5.0* 5.0*  ALBUMIN 1.9* 1.7* 1.7*    Recent Labs Lab Mar 06, 2013 1851  LIPASE 38    Recent Labs Lab 2013-03-06 1950  AMMONIA 28   CBC:  Recent Labs Lab March 06, 2013 1851  WBC 15.5*  NEUTROABS 13.5*  HGB 12.7  HCT 35.2*  MCV 107.6*  PLT 181    Recent Results (from the past 240 hour(s))  URINE CULTURE     Status: None   Collection Time    06-Mar-2013  8:02 PM      Result Value Range Status   Specimen Description URINE, CLEAN CATCH   Final   Special Requests NONE   Final   Culture  Setup Time     Final   Value: 03/06/2013 21:24     Performed at [facility] Count     Final   Value: >=100,000 COLONIES/ML     Performed at [facility]   Culture     Final   Value: ESCHERICHIA COLI     Performed at [facility]   Report Status 02/13/2013 FINAL   Final   Organism ID, Bacteria ESCHERICHIA COLI   Final  MRSA PCR SCREENING     Status: None   Collection Time    March 06, 2013 11:17 PM      Result Value Range Status   MRSA by PCR NEGATIVE  NEGATIVE Final   Comment:            The GeneXpert MRSA Assay (FDA     approved for NASAL specimens     only), is one component of a     comprehensive MRSA colonization     surveillance program. It is not     intended to diagnose MRSA     infection nor to guide or     monitor treatment for     MRSA infections.  GRAM STAIN     Status: None   Collection Time    02/12/13 10:39 AM      Result Value Range Status   Specimen Description FLUID ASCITIC    Final   Special Requests   Final   Gram Stain     Final   Value: NO WBC SEEN     NO ORGANISMS SEEN     CALLED TO K MORGAN,PA 02/12/13 1305 BY K SCHULTZ   Report Status 02/12/2013 FINAL   Final     Studies:  Recent x-ray studies have been reviewed in detail by the Attending Physician     Junious Silk, ANP [facility] Office  [phone] Pager [phone]  **  If unable to reach the above provider after paging please contact the Flow Manager @ [phone]  On-Call/Text Page:      [email or web address]      password [email or web address]  If 7PM-7AM, please contact night-coverage [email or web address] Password [email or web address] 02/14/2013, 2:02 PM   LOS: 3 days   I have personally examined this patient and reviewed the entire database. I have reviewed the above note, made any necessary editorial changes, and agree with its content.  Lonia Blood, MD [facility]

## 2013-02-14 NOTE — Progress Notes (Signed)
Subjective: Urine is lightening up.  Objective: Vital signs in last 24 hours: Temp:  [97.4 F (36.3 C)-98.3 F (36.8 C)] 98 F (36.7 C) (12/12 0434) Pulse Rate:  [79-102] 102 (12/12 0434) Resp:  [14-21] 17 (12/12 0434) BP: (90-103)/(51-69) 103/66 mmHg (12/12 0434) SpO2:  [98 %-100 %] 99 % (12/12 0434) Weight:  [128 lb 12.8 oz (58.423 kg)] 128 lb 12.8 oz (58.423 kg) (12/11 2053) Last BM Date: 02/14/13  Intake/Output from previous day: 12/11 0701 - 12/12 0700 In: 640 [P.O.:240; IV Piggyback:400] Out: 200 [Urine:200] Intake/Output this shift:    General appearance: alert and no distress GI: distended with ascites, but soft  Lab Results:  Recent Labs  02/08/2013 1851  WBC 15.5*  HGB 12.7  HCT 35.2*  PLT 181   BMET  Recent Labs  02/12/13 1111 02/12/13 1655 02/13/13 0605  NA 118* 118* 118*  K 3.1* 3.6 3.9  CL 87* 87* 89*  CO2 19 18* 18*  GLUCOSE 92 94 95  BUN 16 17 20   CREATININE 0.63 0.66 0.82  CALCIUM 7.6* 7.5* 7.4*   LFT  Recent Labs  02/12/13 0300 02/13/13 0605  PROT 5.0* 5.0*  ALBUMIN 1.7* 1.7*  AST 140* 188*  ALT 72* 95*  ALKPHOS 161* 186*  BILITOT 25.1* 25.5*  BILIDIR 17.5*  --   IBILI 7.6*  --    PT/INR  Recent Labs  02/04/2013 1950 02/13/13 0605  LABPROT 17.3* 19.5*  INR 1.45 1.70*   Hepatitis Panel  Recent Labs  02/22/2013 1851  HEPBSAG NEGATIVE  HCVAB Reactive*  HEPAIGM NON REACTIVE  HEPBIGM NON REACTIVE   C-Diff No results found for this basename: CDIFFTOX,  in the last 72 hours Fecal Lactopherrin No results found for this basename: FECLLACTOFRN,  in the last 72 hours  Studies/Results: Nm Hepatobiliary Liver Func  02/12/2013   CLINICAL DATA:  Nausea. Elevated bilirubin. Abdominal pain and jaundice.  EXAM: NUCLEAR MEDICINE HEPATOBILIARY IMAGING  TECHNIQUE: Sequential images of the abdomen were obtained out to 60 minutes following intravenous administration of radiopharmaceutical.  COMPARISON:  None.  RADIOPHARMACEUTICALS:   Tc-9m Choletec  FINDINGS: There was no excretion of contrast into the biliary tree at 2 hr. I suspect the patient has severe hepatocellular disease.  IMPRESSION: Indeterminate hepatobiliary scan with no visualization of the biliary tree secondary to severe hepatocellular disease.   Electronically Signed   By: Geanie Cooley M.D.   On: 02/12/2013 16:09   US Paracentesis  02/12/2013   CLINICAL DATA:  Jaundice, cirrhosis, recurrent ascites  EXAM: ULTRASOUND GUIDED PARACENTESIS  TECHNIQUE: Survey ultrasound of the abdomen was performed and an appropriate skin entry site in the LLQ abdomen was selected. Skin site was marked, prepped with Betadine, and draped in usual sterile fashion, and infiltrated locally with 1% lidocaine. A 5 French multisidehole Yueh sheath needle was advanced into the peritoneal space until fluid could be aspirated. The sheath was advanced and the needle removed. 2.4 liters of bright yellow ascites were aspirated. No immediate complication. The fluid was sent for testing.  IMPRESSION: Technically successful ultrasound guided paracentesis, removing 2.4 liters of ascites.  Read By:  Pattricia Boss PA-C   Electronically Signed   By: Irish Lack M.D.   On: 02/12/2013 11:30    Medications:  Scheduled: . ciprofloxacin  400 mg Intravenous Q12H  . folic acid  1 mg Oral Daily  . furosemide  20 mg Intravenous BID  . influenza vac split quadrivalent PF  0.5 mL Intramuscular Tomorrow-1000  .  LORazepam  0-4 mg Oral Q12H  . metronidazole  500 mg Intravenous Q8H  . multivitamin with minerals  1 tablet Oral Daily  . pneumococcal 23 valent vaccine  0.5 mL Intramuscular Tomorrow-1000  . sodium chloride  3 mL Intravenous Q12H  . spironolactone  12.5 mg Oral Daily  . thiamine  100 mg Oral Daily   Or  . thiamine  100 mg Intravenous Daily   Continuous:   Assessment/Plan: 1) Cirrhotic decompensation. 2) ETOH abuse. 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 4) Severe hyponatremia. 5) Ascites.   The patient  remains neurologically intact.  The lightening of her urine, per her report, is a good improvement.  Today's labs are pending.  Hopefully the fluid restriction will allow her to increase her sodium appropriately.  At that point, diuretics can be started to help with the ascites.  Plan: 1) Continue with fluid restriction. 2) Encourage nutrition.   LOS: 3 days   Loudon Krakow D 02/14/2013, 8:27 AM

## 2013-02-14 NOTE — Care Management Note (Signed)
   CARE MANAGEMENT NOTE 02/14/2013  Patient:  Teresa, House   Account Number:  [id number]  Date Initiated:  02/14/2013  Documentation initiated by:  Shanara Schnieders  Subjective/Objective Assessment:   Pt noted to be without insurance, reported severe cirhosis , low Na, poor prognosis.     Action/Plan:   02/14/13 met with pt who report that someone from finance came to see her she does not rememeber the conversation. This CM contacted Michele [place], financial Counselor re Medicaid application. She will continue to  monitor pt status.   Anticipated DC Date:  02/17/2013   Anticipated DC Plan:  HOME/SELF CARE         Choice offered to / List presented to:             Status of service:  In process, will continue to follow Medicare Important Message given?   (If response is "NO", the following Medicare IM given date fields will be blank) Date Medicare IM given:   Date Additional Medicare IM given:    Discharge Disposition:  HOME/SELF CARE  Per UR Regulation:    If discussed at Long Length of Stay Meetings, dates discussed:    Comments:

## 2013-02-15 LAB — COMPREHENSIVE METABOLIC PANEL
ALT: 106 U/L — ABNORMAL HIGH (ref 0–35)
Alkaline Phosphatase: 170 U/L — ABNORMAL HIGH (ref 39–117)
BUN: 40 mg/dL — ABNORMAL HIGH (ref 6–23)
CO2: 16 mEq/L — ABNORMAL LOW (ref 19–32)
Calcium: 7.6 mg/dL — ABNORMAL LOW (ref 8.4–10.5)
Chloride: 88 mEq/L — ABNORMAL LOW (ref 96–112)
GFR calc Af Amer: 46 mL/min — ABNORMAL LOW (ref >90)
GFR calc non Af Amer: 40 mL/min — ABNORMAL LOW (ref >90)
Glucose, Bld: 97 mg/dL (ref 70–99)
Potassium: 4.2 mEq/L (ref 3.5–5.1)
Sodium: 118 mEq/L — CL (ref 135–145)
Total Bilirubin: 25.5 mg/dL (ref 0.3–1.2)

## 2013-02-15 MED ORDER — METRONIDAZOLE IN NACL 5-0.79 MG/ML-% IV SOLN
500.0000 mg | Freq: Two times a day (BID) | INTRAVENOUS | Status: DC
  Administered 2013-02-15 – 2013-02-20 (×11): 500 mg via INTRAVENOUS
  Filled 2013-02-15 (×14): qty 100

## 2013-02-15 MED ORDER — SODIUM CHLORIDE 0.9 % IV BOLUS (SEPSIS)
500.0000 mL | Freq: Once | INTRAVENOUS | Status: AC
  Administered 2013-02-15: 500 mL via INTRAVENOUS

## 2013-02-15 MED ORDER — SODIUM CHLORIDE 0.9 % IV BOLUS (SEPSIS)
250.0000 mL | Freq: Once | INTRAVENOUS | Status: AC
  Administered 2013-02-15: 250 mL via INTRAVENOUS

## 2013-02-15 NOTE — Progress Notes (Signed)
[facility] Progress Note    Rilla Buckman OZH:[id number] DOB: 04-21-1965 DOA: 02/16/2013 PCP: No PCP Per Patient  Brief narrative: 47 y.o. female who presented to the ER for the second time within a week for worsening jaundice. Since the time of her first presentation she had also devloped abdominal discomfort with distention. She denied any nausea vomiting diarrhea fever chills.   In the ER the patient was found to have elevated LFTs with total bilirubin around 26.9. Sonogram of abdomen showed features concerning for cirrhosis of the liver and ascites. Labs also showed severe hyponatremia. Patient stated that she drank alcohol every day for last 20 years but quit drinking 2 weeks ago when she noticed the  jaundice. She had been slowly tapering off her alcohol intake over the last 2 months. Denied any chest pain or shortness of breath.  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 -TB was 0.4 in 2010 -this admit mild elevation AST/ALT but marked elevation TB direct > indirect -ammonia, platelets and coags normal suggestive of acute hepatitis -Hep C positive on screen this admit -pt confirm positive since 2008-Hep C RNA for viral load pending -GI following  Ascites -post paracentesis 2.4 L in IR 12/10 -fluid yellow / cx and studies pending-NGTD -had abd pain pre admit so treating empirically for SBP -1000 cc FR -cont IV Lasix  Hyponatremia /  Hypokalemia -low sodium due to hypervolemia from cirrhosis - lasix diuresis and follow Na trend  -replete K  Metabolic acidosis -from hypotension and intravascular volume depletion as fluid has shifted intra-abdominally due to ascites -resolved  Tobacco abuse  UTI / Trichomonas infection  -FU cx -cont empiric anbx's -cont Flagyl for Trich  DVT prophylaxis: SCDs Code Status: Full Family Communication: No family present at time of exam  today Disposition Plan/Expected LOS:  Floor-eventual dc home noting has poor 1 year prognosis given presentation with hyponatremia esp if Hep C not active  Consultants: Gastroenterology  Procedures: Paracentesis by interventional radiology  12/10  Antibiotics: Ceftriaxone 12/09 >>> 12/09 Cipro 12/09 >>> Flagyl 12/09 >>>  Objective: Blood pressure 93/59, pulse 84, temperature 97.8 F (36.6 C), temperature source Oral, resp. rate 16, height 5\' 6"  (1.676 m), weight 59.9 kg (132 lb 0.9 oz), SpO2 100.00%.  Intake/Output Summary (Last 24 hours) at 02/15/13 1804 Last data filed at 02/15/13 1612  Gross per 24 hour  Intake    195 ml  Output    150 ml  Net     45 ml   Exam: General: No acute respiratory distress-remains jaundiced Lungs: Clear to auscultation bilaterally without wheezes or crackles, RA Cardiovascular: Regular rate and rhythm without murmur gallop or rub normal S1 and S2, no peripheral edema or JVD Abdomen: mildly protuberant, soft, bowel sounds positive, no rebound, no appreciable mass Musculoskeletal: No significant cyanosis, clubbing of bilateral lower extremities Neurological: Alert and oriented x 3, moves all extremities x 4 without focal neurological deficits, CN 2-12 intact-self ambulatory  Scheduled Meds:  Scheduled Meds: . ciprofloxacin  400 mg Intravenous Q12H  . folic acid  1 mg Oral Daily  . influenza vac split quadrivalent PF  0.5 mL Intramuscular Tomorrow-1000  . LORazepam  0-4 mg Oral Q12H  . metronidazole  500 mg Intravenous Q12H  . multivitamin with minerals  1 tablet Oral Daily  . pneumococcal 23 valent vaccine  0.5 mL Intramuscular Tomorrow-1000  . sodium chloride  3 mL Intravenous Q12H  . spironolactone  12.5 mg Oral Daily  . thiamine  100 mg Oral Daily    Data Reviewed: Basic Metabolic Panel:  Recent Labs Lab 02/12/13 0800 02/12/13 1111 02/12/13 1655 02/13/13 0605 02/15/13 0625  NA 117* 118* 118* 118* 118*  K 3.7 3.1* 3.6 3.9 4.2   CL 87* 87* 87* 89* 88*  CO2 15* 19 18* 18* 16*  GLUCOSE 94 92 94 95 97  BUN 15 16 17 20  40*  CREATININE 0.57 0.63 0.66 0.82 1.54*  CALCIUM 7.5* 7.6* 7.5* 7.4* 7.6*   Liver Function Tests:  Recent Labs Lab 02/07/2013 1851 02/12/13 0300 02/13/13 0605 02/15/13 0625  AST 144* 140* 188* 202*  ALT 76* 72* 95* 106*  ALKPHOS 181* 161* 186* 170*  BILITOT 26.9* 25.1* 25.5* 25.5*  PROT 5.4* 5.0* 5.0* 4.6*  ALBUMIN 1.9* 1.7* 1.7* 1.6*    Recent Labs Lab 02/16/2013 1851  LIPASE 38    Recent Labs Lab 02/28/2013 1950  AMMONIA 28   CBC:  Recent Labs Lab 02/05/2013 1851  WBC 15.5*  NEUTROABS 13.5*  HGB 12.7  HCT 35.2*  MCV 107.6*  PLT 181    Recent Results (from the past 240 hour(s))  URINE CULTURE     Status: None   Collection Time    02/05/2013  8:02 PM      Result Value Range Status   Specimen Description URINE, CLEAN CATCH   Final   Special Requests NONE   Final   Culture  Setup Time     Final   Value: 02/21/2013 21:24     Performed at [facility] Count     Final   Value: >=100,000 COLONIES/ML     Performed at [facility]   Culture     Final   Value: ESCHERICHIA COLI     Performed at [facility]   Report Status 02/13/2013 FINAL   Final   Organism ID, Bacteria ESCHERICHIA COLI   Final  MRSA PCR SCREENING     Status: None   Collection Time    02/09/2013 11:17 PM      Result Value Range Status   MRSA by PCR NEGATIVE  NEGATIVE Final   Comment:            The GeneXpert MRSA Assay (FDA     approved for NASAL specimens     only), is one component of a     comprehensive MRSA colonization     surveillance program. It is not     intended to diagnose MRSA     infection nor to guide or     monitor treatment for     MRSA infections.  GRAM STAIN     Status: None   Collection Time    02/12/13 10:39 AM      Result Value Range Status   Specimen Description FLUID ASCITIC   Final   Special Requests   Final   Gram Stain     Final    Value: NO WBC SEEN     NO ORGANISMS SEEN     CALLED TO K MORGAN,PA 02/12/13 1305 BY K SCHULTZ   Report Status 02/12/2013 FINAL   Final      If 7PM-7AM, please contact night-coverage [email or web address] Password [email or web address] 02/15/2013, 6:04 PM   LOS: 4 days   Kathlen Mody, MD [phone] [facility]

## 2013-02-15 NOTE — Progress Notes (Signed)
BP is 78/46 this morning. No complaints. No distress noted. Notified T. Claiborne Billings NP. Orders received. Administering NS 250 mL IV bolus. Will continue to monitor.

## 2013-02-15 NOTE — Progress Notes (Signed)
Unassigned patient Subjective: Since I last evaluated the patient, she seems to be fairly stabler. She has mild abdominal cramping but denies any other GI complaints at this time. Wants a nicotine patch.    Objective: Vital signs in last 24 hours: Temp:  [97.2 F (36.2 C)-98.6 F (37 C)] 97.8 F (36.6 C) (12/13 1740) Pulse Rate:  [83-94] 84 (12/13 1740) Resp:  [16-17] 16 (12/13 1740) BP: (76-102)/(46-72) 93/59 mmHg (12/13 1740) SpO2:  [95 %-100 %] 100 % (12/13 1740) Weight:  [59.9 kg (132 lb 0.9 oz)] 59.9 kg (132 lb 0.9 oz) (12/12 2043) Last BM Date: 02/15/13  Intake/Output from previous day: 12/12 0701 - 12/13 0700 In: 840 [P.O.:840] Out: -  Intake/Output this shift:   General appearance: alert, cooperative, appears older than stated age, fatigued, icteric and no distress Resp: clear to auscultation bilaterally Cardio: regular rate and rhythm, S1, S2 normal, no murmur, click, rub or gallop GI: normal findings: bowel sounds normal and no organomegaly and abnormal findings:  distended and shifting dullness Extremities: no cyanosis or edema  Lab Results: No results found for this basename: WBC, HGB, HCT, PLT,  in the last 72 hours BMET  Recent Labs  02/13/13 0605 02/15/13 0625  NA 118* 118*  K 3.9 4.2  CL 89* 88*  CO2 18* 16*  GLUCOSE 95 97  BUN 20 40*  CREATININE 0.82 1.54*  CALCIUM 7.4* 7.6*   LFT  Recent Labs  02/15/13 0625  PROT 4.6*  ALBUMIN 1.6*  AST 202*  ALT 106*  ALKPHOS 170*  BILITOT 25.5*   PT/INR  Recent Labs  02/13/13 0605  LABPROT 19.5*  INR 1.70*   Medications: I have reviewed the patient's current medications.  Assessment/Plan: 1) Alcoholic cirrhosis and Hepatitis C with ascites and severe jaundice and hypoantremia. Continue supportive care. Has a very poor prognosis  . LOS: 4 days   Teresa House 02/15/2013, 7:54 PM

## 2013-02-15 NOTE — Progress Notes (Signed)
02/15/2013 patient blood pressure was 76/56, at 0753, Dr Blake Divine was notified and orders were given for 500cc [place] bolus after bolus given at 0915 blood pressure went up to 100/65. [facility] RN.

## 2013-02-16 ENCOUNTER — Inpatient Hospital Stay (HOSPITAL_COMMUNITY): Payer: Medicaid Other

## 2013-02-16 DIAGNOSIS — N179 Acute kidney failure, unspecified: Secondary | ICD-10-CM | POA: Diagnosis not present

## 2013-02-16 DIAGNOSIS — A599 Trichomoniasis, unspecified: Secondary | ICD-10-CM

## 2013-02-16 DIAGNOSIS — I959 Hypotension, unspecified: Secondary | ICD-10-CM | POA: Diagnosis not present

## 2013-02-16 DIAGNOSIS — K7030 Alcoholic cirrhosis of liver without ascites: Principal | ICD-10-CM

## 2013-02-16 DIAGNOSIS — N390 Urinary tract infection, site not specified: Secondary | ICD-10-CM

## 2013-02-16 LAB — COMPREHENSIVE METABOLIC PANEL
AST: 210 U/L — ABNORMAL HIGH (ref 0–37)
Albumin: 1.5 g/dL — ABNORMAL LOW (ref 3.5–5.2)
Alkaline Phosphatase: 160 U/L — ABNORMAL HIGH (ref 39–117)
BUN: 54 mg/dL — ABNORMAL HIGH (ref 6–23)
CO2: 14 mEq/L — ABNORMAL LOW (ref 19–32)
Calcium: 7.4 mg/dL — ABNORMAL LOW (ref 8.4–10.5)
Chloride: 85 mEq/L — ABNORMAL LOW (ref 96–112)
Creatinine, Ser: 2.23 mg/dL — ABNORMAL HIGH (ref 0.50–1.10)
GFR calc Af Amer: 29 mL/min — ABNORMAL LOW (ref >90)
Potassium: 4.8 mEq/L (ref 3.5–5.1)
Total Bilirubin: 25 mg/dL (ref 0.3–1.2)

## 2013-02-16 LAB — SODIUM, URINE, RANDOM: Sodium, Ur: <10 mEq/L

## 2013-02-16 LAB — AMMONIA: Ammonia: 27 umol/L (ref 11–60)

## 2013-02-16 MED ORDER — MORPHINE SULFATE 2 MG/ML IJ SOLN
1.0000 mg | INTRAMUSCULAR | Status: DC | PRN
  Filled 2013-02-16: qty 1

## 2013-02-16 MED ORDER — MIDODRINE HCL 5 MG PO TABS
5.0000 mg | ORAL_TABLET | Freq: Three times a day (TID) | ORAL | Status: DC
  Administered 2013-02-17 – 2013-02-20 (×10): 5 mg via ORAL
  Filled 2013-02-16 (×13): qty 1

## 2013-02-16 MED ORDER — DEXTROSE 5 % IV SOLN
2.0000 ug/min | INTRAVENOUS | Status: DC
  Administered 2013-02-16: 3 ug/min via INTRAVENOUS
  Administered 2013-02-17: 8 ug/min via INTRAVENOUS
  Administered 2013-02-17: 12 ug/min via INTRAVENOUS
  Administered 2013-02-17: 14 ug/min via INTRAVENOUS
  Administered 2013-02-17: 8 ug/min via INTRAVENOUS
  Administered 2013-02-18 – 2013-02-20 (×8): 10 ug/min via INTRAVENOUS
  Administered 2013-02-20: 15 ug/min via INTRAVENOUS
  Administered 2013-02-21: 10 ug/min via INTRAVENOUS
  Filled 2013-02-16 (×16): qty 4

## 2013-02-16 MED ORDER — NICOTINE 21 MG/24HR TD PT24
21.0000 mg | MEDICATED_PATCH | TRANSDERMAL | Status: DC
  Administered 2013-02-16 – 2013-02-25 (×10): 21 mg via TRANSDERMAL
  Filled 2013-02-16 (×11): qty 1

## 2013-02-16 MED ORDER — ALBUMIN HUMAN 25 % IV SOLN
12.5000 g | Freq: Four times a day (QID) | INTRAVENOUS | Status: AC
  Administered 2013-02-16 – 2013-02-18 (×8): 12.5 g via INTRAVENOUS
  Filled 2013-02-16 (×13): qty 50

## 2013-02-16 MED ORDER — MORPHINE SULFATE 2 MG/ML IJ SOLN
INTRAMUSCULAR | Status: AC
  Administered 2013-02-16: 2 mg via INTRAVENOUS
  Filled 2013-02-16: qty 1

## 2013-02-16 NOTE — Progress Notes (Signed)
CRITICAL VALUE ALERT  Critical value received:  SODIUM 115  Date of notification:  02/16/2013  Time of notification:  1128  Critical value read back:yes  Nurse who received alert:  Lovie Macadamia RN MD notified (1st page):  DR Blake Divine  Time of first page:  1135  MD notified (2nd page):  Time of second page:  Responding MD:  DR Blake Divine  Time MD responded:  1140

## 2013-02-16 NOTE — Progress Notes (Signed)
eLink Physician-Brief Progress Note Patient Name: Teresa House DOB: 1965/12/31 MRN: [id number]  Date of Service  02/16/2013   HPI/Events of Note   Suspected hepatorenal syndrome  eICU Interventions   Albumin, midodrine, CVP order placed   Intervention Category Intermediate Interventions: Communication with other healthcare providers and/or family  Lonia Farber 02/16/2013, 7:29 PM

## 2013-02-16 NOTE — Consult Note (Signed)
Renal Service Consult Note [facility]  Teresa House 02/16/2013 Teresa House Requesting Physician:  Dr Blake Divine  Reason for Consult:  Acute renal failure HPI: The patient is a 47 y.o. year-old with hx of etoh abuse admitted with jaundice on 02/09/2013.   She had marked ascites and had 2.4L paracentesis on 12/10.  She rec'd IV lasix for 3-4 days initially. Serum Na was low and she was put on 1L fluid restriction. She has rec'd several days of empiric abx with cipro and flagyl.  Creat was 0.39 at baseline 10 days ago on 12/4 at time of ED visit.  Date   Creat  eGFR 02/06/13  0.39  >90 12/9   0.52 12/10   0.54 12/11   0.82  85 12/13   1.54  40 12/14   2.23  25  Urine Na < 10 (12/10) Urine osm 257  (12/10) UA orange, neg protein, 21-50wbc, no rbc, many bact  Abd [place] > kidneys 10.7/11 cm, no hydro, normal echo  ROS  no n/v  +abd distension  no sob  no hx kidney disease  Past Medical History  Past Medical History  Diagnosis Date  . Hypertension    Past Surgical History  Past Surgical History  Procedure Laterality Date  . Ankle surgry     Family History  Family History  Problem Relation Age of Onset  . Adopted: Yes   Social History  reports that she has been smoking Cigarettes.  She has been smoking about 0.00 packs per day. She does not have any smokeless tobacco history on file. She reports that she drinks alcohol. She reports that she does not use illicit drugs. Allergies  Allergies  Allergen Reactions  . Penicillins Other (See Comments)    Childhood reaction    Home medications Prior to Admission medications   Medication Sig Start Date End Date Taking? Authorizing Provider  aspirin 81 MG tablet Take 81 mg by mouth daily.   Yes Historical Provider, MD  Pseudoeph-Doxylamine-DM-APAP 60-12.08-02-998 MG/30ML LIQD Take 30 mLs by mouth every 6 (six) hours as needed (cough).   Yes Historical Provider, MD   Liver Function Tests  Recent Labs Lab  02/13/13 0605 02/15/13 0625 02/16/13 1005  AST 188* 202* 210*  ALT 95* 106* 116*  ALKPHOS 186* 170* 160*  BILITOT 25.5* 25.5* 25.0*  PROT 5.0* 4.6* 4.6*  ALBUMIN 1.7* 1.6* 1.5*    Recent Labs Lab 02/04/2013 1851  LIPASE 38   CBC  Recent Labs Lab 02/03/2013 1851  WBC 15.5*  NEUTROABS 13.5*  HGB 12.7  HCT 35.2*  MCV 107.6*  PLT 181   Basic Metabolic Panel  Recent Labs Lab 02/12/13 0300 02/12/13 0800 02/12/13 1111 02/12/13 1655 02/13/13 0605 02/15/13 0625 02/16/13 1005  NA 118* 117* 118* 118* 118* 118* 115*  K 2.8* 3.7 3.1* 3.6 3.9 4.2 4.8  CL 86* 87* 87* 87* 89* 88* 85*  CO2 17* 15* 19 18* 18* 16* 14*  GLUCOSE 93 94 92 94 95 97 112*  BUN 14 15 16 17 20  40* 54*  CREATININE 0.54 0.57 0.63 0.66 0.82 1.54* 2.23*  CALCIUM 7.3* 7.5* 7.6* 7.5* 7.4* 7.6* 7.4*    Exam  Blood pressure 91/58, pulse 78, temperature 97.3 F (36.3 C), temperature source Oral, resp. rate 20, height 5\' 6"  (1.676 m), weight 61.735 kg (136 lb 1.6 oz), SpO2 98.00%.  gen: awake, no distress, jaundiced severely  skin: no rash, cyanosis  heent: eomi, sclera anicteric, throat dry  neck:  no jvd, flat neck veins  chest: clear bilat to bases, no rales  cor: regular, no M, rub or gallop  abd: soft, moderate to severe diffuse ascites, nontender, no HSM  ext: no sig LE or UE edema , no joint effusion, no gangrene/ulcers  neuro: alert, ox3, nf, no asterixis  Assessment/Plan: 1. Acute kidney injury: oliguric, in setting of alcoholic cirrhosis and hep C, newly diagnosed. She looks to have hepatorenal syndrome and/or low intravascular volume.  Pt and family want aggressive treatment.  Pt will need transfer to SDU or ICU, placement of central line for CVP, IVF"s to get CVP up if low, and levophed gtt to increase MAP 10-15 mm Hg above baseline, and IV albumin 25 gm qid for 2days. This is initial Rx.  Will follow.  Have d/w primary MD.   2. Alcoholic cirrhosis and hep C with ascites and severe  jaundice 3. Hyponatremia: continue fluid restriction, correct any intravascular volume deficit (by CVP) with NS.  If euvolemic by CVP may need 3% saline for this 4. ETOH abuse   Vinson Moselle MD (pgr) [phone]    [phone] 02/16/2013, 2:49 PM

## 2013-02-16 NOTE — Progress Notes (Signed)
[facility] Progress Note    Teresa House NWG:[id number] DOB: September 11, 1965 DOA: Feb 17, 2013 PCP: No PCP Per Patient  Brief narrative: 47 y.o. female who presented to the ER for the second time within a week for worsening jaundice. Since the time of her first presentation she had also devloped abdominal discomfort with distention. She denied any nausea vomiting diarrhea fever chills.   In the ER the patient was found to have elevated LFTs with total bilirubin around 26.9. Sonogram of abdomen showed features concerning for cirrhosis of the liver and ascites. Labs also showed severe hyponatremia. Patient stated that she drank alcohol every day for last 20 years but quit drinking 2 weeks ago when she noticed the  jaundice. She had been slowly tapering off her alcohol intake over the last 2 months. Denied any chest pain or shortness of breath.  HPI/Subjective: Sleeping and reports abdominal pain.   Assessment/Plan:  Hepatitis C + Alcoholic cirrhosis of liver -TB was 0.4 in 2010 -this admit mild elevation AST/ALT but marked elevation TB direct > indirect -ammonia, platelets and coags normal suggestive of acute hepatitis -Hep C positive on screen this admit -pt confirm positive since 2008-Hep C RNA for viral load pending -GI following  Ascites -post paracentesis 2.4 L in IR 12/10 -fluid yellow / cx and studies pending-NGTD -had abd pain pre admit so treating empirically for SBP -1000 cc FR   Hyponatremia /  Hypokalemia - worsening,. Pt received of NS yesterday.  -replete K  Metabolic acidosis and acute renal failure -from hypotension and intravascular volume depletion as fluid has shifted intra-abdominally due to ascites. - possibly hepatorenal syndrome.  - RENAL consulted and recommended transfer to ICU. CVP for IV levophed and IV albumin.  - spoke to Dr Blima Dessert, who will see the patient and make the transfer today.  - appreciate PCCM assistance.   Tobacco abuse -  nicotine patch.  UTI / Trichomonas infection  -FU cx -cont empiric anbx's -cont Flagyl for Trich  DVT prophylaxis: SCDs Code Status: Full Family Communication: discussed with the husband at bedside.  Disposition Plan/Expected LOS:  Floor-eventual dc home noting has poor 1 year prognosis given presentation with hyponatremia esp if Hep C not active  Consultants: Gastroenterology Renal pCCM  Procedures: Paracentesis by interventional radiology  12/10  Antibiotics: Ceftriaxone 12/09 >>> 12/09 Cipro 12/09 >>> Flagyl 12/09 >>>  Objective: Blood pressure 91/58, pulse 78, temperature 97.3 F (36.3 C), temperature source Oral, resp. rate 20, height 5\' 6"  (1.676 m), weight 61.735 kg (136 lb 1.6 oz), SpO2 98.00%.  Intake/Output Summary (Last 24 hours) at 02/16/13 1156 Last data filed at 02/16/13 0900  Gross per 24 hour  Intake    395 ml  Output    150 ml  Net    245 ml   Exam:   Scheduled Meds:  Scheduled Meds: . ciprofloxacin  400 mg Intravenous Q12H  . folic acid  1 mg Oral Daily  . influenza vac split quadrivalent PF  0.5 mL Intramuscular Tomorrow-1000  . metronidazole  500 mg Intravenous Q12H  . multivitamin with minerals  1 tablet Oral Daily  . pneumococcal 23 valent vaccine  0.5 mL Intramuscular Tomorrow-1000  . sodium chloride  3 mL Intravenous Q12H  . spironolactone  12.5 mg Oral Daily  . thiamine  100 mg Oral Daily    Data Reviewed: Basic Metabolic Panel:  Recent Labs Lab 02/12/13 1111 02/12/13 1655 02/13/13 0605 02/15/13 0625 02/16/13 1005  NA 118* 118* 118* 118* 115*  K  3.1* 3.6 3.9 4.2 4.8  CL 87* 87* 89* 88* 85*  CO2 19 18* 18* 16* 14*  GLUCOSE 92 94 95 97 112*  BUN 16 17 20  40* 54*  CREATININE 0.63 0.66 0.82 1.54* 2.23*  CALCIUM 7.6* 7.5* 7.4* 7.6* 7.4*   Liver Function Tests:  Recent Labs Lab 2013/02/15 1851 02/12/13 0300 02/13/13 0605 02/15/13 0625 02/16/13 1005  AST 144* 140* 188* 202* 210*  ALT 76* 72* 95* 106* 116*  ALKPHOS 181*  161* 186* 170* 160*  BILITOT 26.9* 25.1* 25.5* 25.5* 25.0*  PROT 5.4* 5.0* 5.0* 4.6* 4.6*  ALBUMIN 1.9* 1.7* 1.7* 1.6* 1.5*    Recent Labs Lab 15-Feb-2013 1851  LIPASE 38    Recent Labs Lab 2013/02/15 1950  AMMONIA 28   CBC:  Recent Labs Lab 02/15/13 1851  WBC 15.5*  NEUTROABS 13.5*  HGB 12.7  HCT 35.2*  MCV 107.6*  PLT 181    Recent Results (from the past 240 hour(s))  URINE CULTURE     Status: None   Collection Time    Feb 15, 2013  8:02 PM      Result Value Range Status   Specimen Description URINE, CLEAN CATCH   Final   Special Requests NONE   Final   Culture  Setup Time     Final   Value: 02/15/13 21:24     Performed at [facility] Count     Final   Value: >=100,000 COLONIES/ML     Performed at [facility]   Culture     Final   Value: ESCHERICHIA COLI     Performed at [facility]   Report Status 02/13/2013 FINAL   Final   Organism ID, Bacteria ESCHERICHIA COLI   Final  MRSA PCR SCREENING     Status: None   Collection Time    02/15/2013 11:17 PM      Result Value Range Status   MRSA by PCR NEGATIVE  NEGATIVE Final   Comment:            The GeneXpert MRSA Assay (FDA     approved for NASAL specimens     only), is one component of a     comprehensive MRSA colonization     surveillance program. It is not     intended to diagnose MRSA     infection nor to guide or     monitor treatment for     MRSA infections.  GRAM STAIN     Status: None   Collection Time    02/12/13 10:39 AM      Result Value Range Status   Specimen Description FLUID ASCITIC   Final   Special Requests   Final   Gram Stain     Final   Value: NO WBC SEEN     NO ORGANISMS SEEN     CALLED TO K MORGAN,PA 02/12/13 1305 BY K SCHULTZ   Report Status 02/12/2013 FINAL   Final      If 7PM-7AM, please contact night-coverage [email or web address] Password [email or web address] 02/16/2013, 11:56 AM   LOS: 5 days   Kathlen Mody, MD [phone] [facility]

## 2013-02-16 NOTE — Procedures (Signed)
Central Venous Catheter Insertion Procedure Note Nikesha Kwasny [id number] Dec 11, 1965  Procedure: Insertion of Central Venous Catheter Indications: Drug and/or fluid administration  Procedure Details Consent: Risks of procedure as well as the alternatives and risks of each were explained to the (patient/caregiver).  Consent for procedure obtained. Time Out: Verified patient identification, verified procedure, site/side was marked, verified correct patient position, special equipment/implants available, medications/allergies/relevent history reviewed, required imaging and test results available.  Performed  Maximum sterile technique was used including antiseptics, cap, gloves, gown, hand hygiene, mask and sheet. Skin prep: Chlorhexidine; local anesthetic administered A antimicrobial bonded/coated triple lumen catheter was placed in the right internal jugular vein using the Seldinger technique.  Placed with ultrasound guidance.  Made two attempts at Rt [place] vein >> vein entered, but lost due to decrease volume.  Then placed in Rt IJ vein.  Evaluation Blood flow good Complications: No apparent complications Patient did tolerate procedure well. Chest X-ray ordered to verify placement.  CXR: pending.  Coralyn Helling, MD [facility]Critical Care 02/16/2013, 6:26 PM Pager:  [phone] After 3pm call: [phone]

## 2013-02-16 NOTE — Consult Note (Signed)
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  She developed renal failure with concern for hepatorenal syndrome.  She was transferred to ICU for levophed therapy, and PCCM assumed care.  SIGNIFICANT EVENTS: 12/10 Paracentesis >> 2.4 liters, GI consulted 12/14 Renal consulted, transfer to ICU for levophed therapy  STUDIES:  12/09 Abd u/s >> cirrhosis, ascites, normal kidneys, gallbladder sludge 12/09 Heptobiliary scan >> Indeterminate hepatobiliary scan with no visualization of the biliary tree secondary to severe hepatocellular disease. Acute hep panel 12/09 >> HCV Ab reactive HIV 12/10 >> non reactive 12/10 Urine Na < 10, Urine osmo 257 Hep C viral load 12/11 >> negative  LINES / TUBES: CVL 12/14 >>   CULTURES: U/A 12/09 >> trichomonas Urine 12/09 >> E coli  ANTIBIOTICS: Rocephin 12/09 >> 12/09 Flagyl 12/09 >>  Cipro 12/09 >>   HISTORY OF PRESENT ILLNESS:   47 yo female with hx of ETOH and cirrhosis.  Last drink was 10 days prior to admit.  She has hx of ETOH seizures.  She developed progressive jaundice, abdominal bloating and abdominal pain.  She was admitted to hospital.  She had paracentesis done with benign fluid results.  She was evaluated by GI.  She was treated with Abx for UTI and trichomonas.  She was given lasix.  She was noted to develop progressive renal dysfunction and acidosis.  She was evaluated by nephrology.  Concern was for hepatorenal syndrome.  Patient and family agreed for aggressive intervention.  Critical care was asked to assess for ICU transfer to start levophed therapy.  PAST MEDICAL HISTORY :  Past Medical History  Diagnosis Date  .  Hypertension    Past Surgical History  Procedure Laterality Date  . Ankle surgry     Prior to Admission medications   Medication Sig Start Date End Date Taking? Authorizing Provider  aspirin 81 MG tablet Take 81 mg by mouth daily.   Yes Historical Provider, MD  Pseudoeph-Doxylamine-DM-APAP 60-12.08-02-998 MG/30ML LIQD Take 30 mLs by mouth every 6 (six) hours as needed (cough).   Yes Historical Provider, MD   Allergies  Allergen Reactions  . Penicillins Other (See Comments)    Childhood reaction     FAMILY HISTORY:  Family History  Problem Relation Age of Onset  . Adopted: Yes   SOCIAL HISTORY:  reports that she has been smoking Cigarettes.  She has been smoking about 0.00 packs per day. She does not have any smokeless tobacco history on file. She reports that she drinks alcohol. She reports that she does not use illicit drugs.  REVIEW OF SYSTEMS:   C/o bloating.  Denies nausea, diarrhea, chest pain, or cough.  SUBJECTIVE:   VITAL SIGNS: Temp:  [97.3 F (36.3 C)-97.8 F (36.6 C)] 97.4 F (36.3 C) (12/14 1631) Pulse Rate:  [54-87] 79 (12/14 1631) Resp:  [16-20] 18 (12/14 1631) BP: (80-115)/(54-59) 93/58 mmHg (12/14 1631) SpO2:  [95 %-100 %] 99 % (12/14 1631) Weight:  [136 lb 1.6 oz (61.735 kg)] 136 lb 1.6 oz (61.735 kg) (12/13 2257) Room air  INTAKE / OUTPUT: Intake/Output     12/13 0701 - 12/14 0700 12/14 0701 - 12/15 0700   P.O. 75 120   IV Piggyback 200  200   Total Intake(mL/kg) 275 (4.5) 320 (5.2)   Urine (mL/kg/hr) 150 (0.1)    Total Output 150     Net +125 +320        Urine Occurrence 1 x 1 x   Stool Occurrence 3 x      PHYSICAL EXAMINATION: General: no distress Neuro:  Alert, normal strength, follows commands HEENT:  Jaundice, no sinus tenderness Cardiovascular:  Regular, no murmur Lungs:  Decreased BS at bases, no wheeze Abdomen:  Distended, + fluid wave, non tender Musculoskeletal:  1+ leg edema Skin:  jaundice  LABS:  CBC  Recent Labs Lab  02-24-13 1851  WBC 15.5*  HGB 12.7  HCT 35.2*  PLT 181   Coag's  Recent Labs Lab February 24, 2013 1950 02/13/13 0605  APTT 29 33  INR 1.45 1.70*   BMET  Recent Labs Lab 02/13/13 0605 02/15/13 0625 02/16/13 1005  NA 118* 118* 115*  K 3.9 4.2 4.8  CL 89* 88* 85*  CO2 18* 16* 14*  BUN 20 40* 54*  CREATININE 0.82 1.54* 2.23*  GLUCOSE 95 97 112*   Electrolytes  Recent Labs Lab 02/13/13 0605 02/15/13 0625 02/16/13 1005  CALCIUM 7.4* 7.6* 7.4*   Sepsis Markers  Recent Labs Lab 2013-02-24 2359  LATICACIDVEN 1.5   ABG No results found for this basename: PHART, PCO2ART, PO2ART,  in the last 168 hours Liver Enzymes  Recent Labs Lab 02/13/13 0605 02/15/13 0625 02/16/13 1005  AST 188* 202* 210*  ALT 95* 106* 116*  ALKPHOS 186* 170* 160*  BILITOT 25.5* 25.5* 25.0*  ALBUMIN 1.7* 1.6* 1.5*   Cardiac Enzymes No results found for this basename: TROPONINI, PROBNP,  in the last 168 hours Glucose No results found for this basename: GLUCAP,  in the last 168 hours  Imaging No results found.   ASSESSMENT / PLAN:  PULMONARY A: Passive atelectasis. Tobacco abuse. P:   -bronchial hygiene -monitor oxygenation -needs smoking cessation; continue nicoderm  CARDIOVASCULAR A:  Hypotension. P:  -monitor hemodynamics  RENAL A:   Probable heptorenal syndrome. Hyponatremia. P:   -start albumin, levophed per renal -f/u renal fx, urine outpt, electrolytes  GASTROINTESTINAL A:   ETOH cirrhosis with jaundice. P:   -f/u LFT's -protonix for SUP -GI following  HEMATOLOGIC A:   No issues. P:  -f/u CBC, coag's -SCD for DVT prevention  INFECTIOUS A:   E coli UTI. Trichomonas. P:   -continue cipro, flagyl  ENDOCRINE A:   No issues.   P:   -check cortisol, TSH  NEUROLOGIC A:   ETOH abuse >> last drink reported to be 10 days prior to admission. P:   -monitor for DT's, but low likelihood this far out from her last drink  Updated pt's family at  bedside.  Coralyn Helling, MD [facility] Pulmonary/Critical Care 02/16/2013, 5:29 PM Pager:  [phone] After 3pm call: [phone]

## 2013-02-16 NOTE — Progress Notes (Addendum)
Unassigned patient Subjective: Since I last evaluated the patient,she has had worsening renal function. She has had worsening ascites. Plans are to transfer her to stepdown as she has acute oliguric renal failure ?HRS.    Objective: Vital signs in last 24 hours: Temp:  [97.3 F (36.3 C)-97.8 F (36.6 C)] 97.3 F (36.3 C) (12/14 1034) Pulse Rate:  [54-87] 78 (12/14 1034) Resp:  [16-20] 20 (12/14 1034) BP: (80-115)/(54-59) 91/58 mmHg (12/14 1034) SpO2:  [95 %-100 %] 98 % (12/14 1034) Weight:  [61.735 kg (136 lb 1.6 oz)] 61.735 kg (136 lb 1.6 oz) (12/13 2257) Last BM Date: 02/16/13  Intake/Output from previous day: 12/13 0701 - 12/14 0700 In: 275 [P.O.:75; IV Piggyback:200] Out: 150 [Urine:150] Intake/Output this shift: Total I/O In: 320 [P.O.:120; IV Piggyback:200] Out: -   General appearance: alert, cooperative, appears older than stated age, distracted, fatigued, severely jaundiced and mild distress due to abdominal distention Resp: clear to auscultation bilaterally Cardio: regular rate and rhythm, S1, S2 normal, no murmur, click, rub or gallop GI: soft, distended with shifting dullness; bowel sounds normal; no masses,  no organomegaly Extremities: extremities normal, atraumatic, no cyanosis or edema  Lab Results: No results found for this basename: WBC, HGB, HCT, PLT,  in the last 72 hours BMET  Recent Labs  02/15/13 0625 02/16/13 1005  NA 118* 115*  K 4.2 4.8  CL 88* 85*  CO2 16* 14*  GLUCOSE 97 112*  BUN 40* 54*  CREATININE 1.54* 2.23*  CALCIUM 7.6* 7.4*   LFT  Recent Labs  02/16/13 1005  PROT 4.6*  ALBUMIN 1.5*  AST 210*  ALT 116*  ALKPHOS 160*  BILITOT 25.0*   Medications: I have reviewed the patient's current medications.  Assessment/Plan: 1) Alcoholic cirrhosis, ascites with Hepatitis C and acute renal failure ?HRS. Continue present care.  2) Hyponatremia.     LOS: 5 days   Lexee Brashears 02/16/2013, 3:58 PM

## 2013-02-16 NOTE — Progress Notes (Signed)
02/16/2013  Patient c/o of abdominal discomfort, and not voiding enough this evening. Bladder scan was reported patient had >999 in bladder. Md was notified and orders were given for a In and out cath, rn was able to get 20cc out. Dr Blake Divine was notified and was aware. [facility] RN>

## 2013-02-17 DIAGNOSIS — A419 Sepsis, unspecified organism: Secondary | ICD-10-CM

## 2013-02-17 LAB — CBC
HCT: 34.3 % — ABNORMAL LOW (ref 36.0–46.0)
Hemoglobin: 12.7 g/dL (ref 12.0–15.0)
MCH: 38.7 pg — ABNORMAL HIGH (ref 26.0–34.0)
MCHC: 37 g/dL — ABNORMAL HIGH (ref 30.0–36.0)
MCV: 104.6 fL — ABNORMAL HIGH (ref 78.0–100.0)
RBC: 3.28 MIL/uL — ABNORMAL LOW (ref 3.87–5.11)

## 2013-02-17 LAB — COMPREHENSIVE METABOLIC PANEL
ALT: 112 U/L — ABNORMAL HIGH (ref 0–35)
AST: 181 U/L — ABNORMAL HIGH (ref 0–37)
Albumin: 2.1 g/dL — ABNORMAL LOW (ref 3.5–5.2)
Alkaline Phosphatase: 149 U/L — ABNORMAL HIGH (ref 39–117)
BUN: 63 mg/dL — ABNORMAL HIGH (ref 6–23)
Chloride: 83 mEq/L — ABNORMAL LOW (ref 96–112)
GFR calc Af Amer: 28 mL/min — ABNORMAL LOW (ref >90)
Glucose, Bld: 153 mg/dL — ABNORMAL HIGH (ref 70–99)
Potassium: 4.5 mEq/L (ref 3.5–5.1)
Sodium: 112 mEq/L — CL (ref 135–145)
Total Bilirubin: 28.9 mg/dL (ref 0.3–1.2)
Total Protein: 4.7 g/dL — ABNORMAL LOW (ref 6.0–8.3)

## 2013-02-17 LAB — CORTISOL: Cortisol, Plasma: 34.5 ug/dL

## 2013-02-17 MED ORDER — SODIUM CHLORIDE 0.9 % IV BOLUS (SEPSIS)
500.0000 mL | Freq: Once | INTRAVENOUS | Status: AC
  Administered 2013-02-17: 500 mL via INTRAVENOUS

## 2013-02-17 MED ORDER — LEVOTHYROXINE SODIUM 50 MCG PO TABS
50.0000 ug | ORAL_TABLET | Freq: Every day | ORAL | Status: DC
  Administered 2013-02-17 – 2013-02-24 (×8): 50 ug via ORAL
  Filled 2013-02-17 (×12): qty 1

## 2013-02-17 MED ORDER — ALBUMIN HUMAN 25 % IV SOLN
25.0000 g | Freq: Once | INTRAVENOUS | Status: AC
  Administered 2013-02-17: 25 g via INTRAVENOUS
  Filled 2013-02-17: qty 100

## 2013-02-17 MED ORDER — MORPHINE SULFATE 2 MG/ML IJ SOLN
1.0000 mg | INTRAMUSCULAR | Status: DC | PRN
  Administered 2013-02-17 – 2013-02-22 (×18): 1 mg via INTRAVENOUS
  Filled 2013-02-17 (×17): qty 1

## 2013-02-17 MED ORDER — VASOPRESSIN 20 UNIT/ML IJ SOLN
0.0300 [IU]/min | INTRAVENOUS | Status: DC
  Administered 2013-02-17 – 2013-02-19 (×3): 0.03 [IU]/min via INTRAVENOUS
  Filled 2013-02-17 (×3): qty 2.5

## 2013-02-17 MED ORDER — SODIUM CHLORIDE 0.9 % IV BOLUS (SEPSIS)
1000.0000 mL | Freq: Once | INTRAVENOUS | Status: AC
  Administered 2013-02-17: 1000 mL via INTRAVENOUS

## 2013-02-17 MED ORDER — CIPROFLOXACIN IN D5W 400 MG/200ML IV SOLN
400.0000 mg | INTRAVENOUS | Status: DC
  Administered 2013-02-18 – 2013-02-19 (×2): 400 mg via INTRAVENOUS
  Filled 2013-02-17 (×3): qty 200

## 2013-02-17 MED ORDER — LORAZEPAM 0.5 MG PO TABS
0.5000 mg | ORAL_TABLET | Freq: Two times a day (BID) | ORAL | Status: DC | PRN
  Administered 2013-02-17 – 2013-02-22 (×3): 0.5 mg via ORAL
  Filled 2013-02-17 (×3): qty 1

## 2013-02-17 MED ORDER — STERILE WATER FOR INJECTION IV SOLN
150.0000 meq | Freq: Once | INTRAVENOUS | Status: AC
  Administered 2013-02-17: 150 meq via INTRAVENOUS
  Filled 2013-02-17: qty 850

## 2013-02-17 NOTE — Progress Notes (Signed)
Subjective: Feeling okay.  Events over the weekend noted.  Objective: Vital signs in last 24 hours: Temp:  [97.3 F (36.3 C)-98.7 F (37.1 C)] 97.4 F (36.3 C) (12/15 0758) Pulse Rate:  [73-99] 80 (12/15 0600) Resp:  [8-20] 11 (12/15 0600) BP: (89-122)/(58-90) 108/76 mmHg (12/15 0600) SpO2:  [94 %-100 %] 99 % (12/15 0600) Weight:  [135 lb 5.8 oz (61.4 kg)-136 lb 0.4 oz (61.7 kg)] 135 lb 5.8 oz (61.4 kg) (12/15 0500) Last BM Date: 02/16/13  Intake/Output from previous day: 12/14 0701 - 12/15 0700 In: 2458 [P.O.:120; I.V.:538; IV Piggyback:1800] Out: -  Intake/Output this shift:    General appearance: alert and no distress GI: distended with ascites, soft  Lab Results:  Recent Labs  02/17/13 0545  WBC 37.2*  HGB 12.7  HCT 34.3*  PLT 156   BMET  Recent Labs  02/15/13 0625 02/16/13 1005 02/17/13 0425  NA 118* 115* 112*  K 4.2 4.8 4.5  CL 88* 85* 83*  CO2 16* 14* 12*  GLUCOSE 97 112* 153*  BUN 40* 54* 63*  CREATININE 1.54* 2.23* 2.35*  CALCIUM 7.6* 7.4* 7.2*   LFT  Recent Labs  02/17/13 0425  PROT 4.7*  ALBUMIN 2.1*  AST 181*  ALT 112*  ALKPHOS 149*  BILITOT 28.9*   PT/INR No results found for this basename: LABPROT, INR,  in the last 72 hours Hepatitis Panel No results found for this basename: HEPBSAG, HCVAB, HEPAIGM, HEPBIGM,  in the last 72 hours C-Diff No results found for this basename: CDIFFTOX,  in the last 72 hours Fecal Lactopherrin No results found for this basename: FECLLACTOFRN,  in the last 72 hours  Studies/Results: Dg Chest Port 1 View  02/16/2013   CLINICAL DATA:  Central line placement, hypertension  EXAM: PORTABLE CHEST - 1 VIEW  COMPARISON:  Portable exam 1825 hr compared to 2013-03-11  FINDINGS: Right jugular central venous catheter with tip projecting over the cavoatrial junction.  Normal heart size, mediastinal contours and pulmonary vascularity.  Bibasilar atelectasis.  Chronic elevation right diaphragm.  No pulmonary  infiltrate, pleural effusion or pneumothorax.  Bones unremarkable.  IMPRESSION: No pneumothorax following right jugular line placement.  Bibasilar atelectasis.   Electronically Signed   By: Ulyses Southward M.D.   On: 02/16/2013 18:53    Medications:  Scheduled: . albumin human  12.5 g Intravenous Q6H  . ciprofloxacin  400 mg Intravenous Q12H  . metronidazole  500 mg Intravenous Q12H  . midodrine  5 mg Oral TID WC  . nicotine  21 mg Transdermal Q24H  . sodium chloride  3 mL Intravenous Q12H   Continuous: . norepinephrine (LEVOPHED) Adult infusion 14 mcg/min (02/17/13 0705)    Assessment/Plan: 1) Decompensated cirrhosis. 2) HRS. 3) Severe hyponatremia. 4) Ascites. 5) History of ETOH abuse.   Her sodium dropped even more to 112.  No recorded urinary output since 02/15/2013.  Her prognosis is extremely poor.  Current she is mentating, but she is weaker than compared to my examination last week.  I would not be surprised if she becomes comatose.  I doubt that there is any intervention that can turn her around.  Plan: 1) Continue with current management.  LOS: 6 days   Carrell Rahmani D 02/17/2013, 8:19 AM

## 2013-02-17 NOTE — Progress Notes (Signed)
Thank you for consulting the Palliative Medicine Team at [facility] to meet your patient's and family's needs.   The reason that you asked [place] to see your patient is  For GOC  We have scheduled your patient for a meeting: 12/15 11 am  The Surrogate decision make is: patient  Contact information:  Other family members that need to be present:  Significant other to be present    Your patient is able/unable to participate: Yes  Additional Narrative:   Teresa Urbieta [name], MD MBA The Palliative Medicine Team at [facility] Phone: [phone] Pager: [phone]

## 2013-02-17 NOTE — Progress Notes (Signed)
Spoke with Dr. Vassie Loll about pt's abdominal discomfort and also about low CVPs.  Orders to give 25g albumin bolus over 1 hr.  Will continue monitor.

## 2013-02-17 NOTE — Progress Notes (Signed)
Patient ID: Teresa House, female   DOB: [birth year], 47 y.o.   MRN: [id number] S:c/o abd pain from "bloating" O:BP 113/81  Pulse 80  Temp(Src) 98.5 F (36.9 C) (Oral)  Resp 13  Ht 5\' 6"  (1.676 m)  Wt 61.4 kg (135 lb 5.8 oz)  BMI 21.86 kg/m2  SpO2 98%  Intake/Output Summary (Last 24 hours) at 02/17/13 1545 Last data filed at 02/17/13 1500  Gross per 24 hour  Intake 3686.56 ml  Output    630 ml  Net 3056.56 ml   Intake/Output: I/O last 3 completed shifts: In: 2458 [P.O.:120; I.V.:538; IV Piggyback:1800] Out: 200 [Urine:200]  Intake/Output this shift:  Total I/O In: 1748.6 [P.O.:20; I.V.:391.1; IV Piggyback:1337.5] Out: 430 [Urine:430] Weight change: -0.035 kg (-1.2 oz) LKG:MWNU jaundiced WF in NAD CVS:no rub Resp:decreased BS at bases UVO:ZDGUYQIHK, +ascites/fluid wave VQQ:VZDGLOVFI edema   Recent Labs Lab 02-14-13 1851 02/12/13 0300 02/12/13 0800 02/12/13 1111 02/12/13 1655 02/13/13 0605 02/15/13 0625 02/16/13 1005 02/17/13 0425  NA 115* 118* 117* 118* 118* 118* 118* 115* 112*  K 3.5 2.8* 3.7 3.1* 3.6 3.9 4.2 4.8 4.5  CL 83* 86* 87* 87* 87* 89* 88* 85* 83*  CO2 18* 17* 15* 19 18* 18* 16* 14* 12*  GLUCOSE 116* 93 94 92 94 95 97 112* 153*  BUN 14 14 15 16 17 20  40* 54* 63*  CREATININE 0.52 0.54 0.57 0.63 0.66 0.82 1.54* 2.23* 2.35*  ALBUMIN 1.9* 1.7*  --   --   --  1.7* 1.6* 1.5* 2.1*  CALCIUM 7.7* 7.3* 7.5* 7.6* 7.5* 7.4* 7.6* 7.4* 7.2*  PHOS  --   --   --   --   --   --   --   --  5.6*  AST 144* 140*  --   --   --  188* 202* 210* 181*  ALT 76* 72*  --   --   --  95* 106* 116* 112*   Liver Function Tests:  Recent Labs Lab 02/15/13 0625 02/16/13 1005 02/17/13 0425  AST 202* 210* 181*  ALT 106* 116* 112*  ALKPHOS 170* 160* 149*  BILITOT 25.5* 25.0* 28.9*  PROT 4.6* 4.6* 4.7*  ALBUMIN 1.6* 1.5* 2.1*    Recent Labs Lab 02/14/13 1851  LIPASE 38    Recent Labs Lab 02-14-13 1950 02/16/13 1816  AMMONIA 28 27   CBC:  Recent Labs Lab  02/14/2013 1851 02/17/13 0545  WBC 15.5* 37.2*  NEUTROABS 13.5*  --   HGB 12.7 12.7  HCT 35.2* 34.3*  MCV 107.6* 104.6*  PLT 181 156   Cardiac Enzymes: No results found for this basename: CKTOTAL, CKMB, CKMBINDEX, TROPONINI,  in the last 168 hours CBG: No results found for this basename: GLUCAP,  in the last 168 hours  Iron Studies: No results found for this basename: IRON, TIBC, TRANSFERRIN, FERRITIN,  in the last 72 hours Studies/Results: Dg Chest Port 1 View  02/16/2013   CLINICAL DATA:  Central line placement, hypertension  EXAM: PORTABLE CHEST - 1 VIEW  COMPARISON:  Portable exam 1825 hr compared to 02-14-2013  FINDINGS: Right jugular central venous catheter with tip projecting over the cavoatrial junction.  Normal heart size, mediastinal contours and pulmonary vascularity.  Bibasilar atelectasis.  Chronic elevation right diaphragm.  No pulmonary infiltrate, pleural effusion or pneumothorax.  Bones unremarkable.  IMPRESSION: No pneumothorax following right jugular line placement.  Bibasilar atelectasis.   Electronically Signed   By: Ulyses Southward M.D.   On: 02/16/2013 18:53   .  albumin human  12.5 g Intravenous Q6H  . [START ON 02/18/2013] ciprofloxacin  400 mg Intravenous Q24H  . levothyroxine  50 mcg Oral QAC breakfast  . metronidazole  500 mg Intravenous Q12H  . midodrine  5 mg Oral TID WC  . nicotine  21 mg Transdermal Q24H  . sodium chloride  3 mL Intravenous Q12H    BMET    Component Value Date/Time   NA 112* 02/17/2013 0425   K 4.5 02/17/2013 0425   CL 83* 02/17/2013 0425   CO2 12* 02/17/2013 0425   GLUCOSE 153* 02/17/2013 0425   BUN 63* 02/17/2013 0425   CREATININE 2.35* 02/17/2013 0425   CALCIUM 7.2* 02/17/2013 0425   GFRNONAA 24* 02/17/2013 0425   GFRAA 28* 02/17/2013 0425   CBC    Component Value Date/Time   WBC 37.2* 02/17/2013 0545   RBC 3.28* 02/17/2013 0545   RBC 4.04 07/15/2008 1248   HGB 12.7 02/17/2013 0545   HCT 34.3* 02/17/2013 0545   PLT 156  02/17/2013 0545   MCV 104.6* 02/17/2013 0545   MCH 38.7* 02/17/2013 0545   MCHC 37.0* 02/17/2013 0545   RDW 15.1 02/17/2013 0545   LYMPHSABS 0.8 2013/02/15 1851   MONOABS 1.1* Feb 15, 2013 1851   EOSABS 0.1 February 15, 2013 1851   BASOSABS 0.0 02/15/2013 1851     Assessment/Plan:  1. AKI: possible hepatorenal syndrome vs ischemic ATN in setting of large volume paracentesis and 3rd spacing with decreased renal perfusion.  Agree with treatment for HRS for now.  Low CVP and receiving bolus of NS.   2. Hyponatremia- secondary to cirrhosis 3. Metabolic acidosis- will need to start bicarb drip (will administer slowly given ascites) and may require HD if this worsens and if she and family want more aggressive measures. 4. Painless jaundice- will need to r/o malignancy  5. Cirrhosis- secondary to Etoh and Hep C.  per GI, poor prognosis.  She is not a liver transplant candidate given her recent Etoh use and agree with palliative care consult to help set goals/limits of care.  We may be heading into medical futility as I don't think a tertiary care center would accept a transfer as she is not on the transplant list.  Teresa House A

## 2013-02-17 NOTE — Progress Notes (Signed)
PULMONARY  / CRITICAL CARE MEDICINE  Name: Teresa House MRN: [id number] DOB: [birth year]    ADMISSION DATE:  16-Feb-2013 CONSULTATION DATE:  02/16/2013  REFERRING MD :  Kathlen Mody  CHIEF COMPLAINT:  Yellow  BRIEF PATIENT DESCRIPTION:  47 yo female smoker with cirrhosis from ETOH admitted with progressive jaundice, bloating, and abdominal discomfort.  She developed renal failure with concern for hepatorenal syndrome.  She was transferred to ICU for levophed therapy, and PCCM assumed care.  SIGNIFICANT EVENTS: 12/10 Paracentesis >> 2.4 liters, GI consulted 12/14 Renal consulted, transfer to ICU for levophed therapy  STUDIES:  12/09 Abd u/s >> cirrhosis, ascites, normal kidneys, gallbladder sludge 12/09 Heptobiliary scan >> Indeterminate hepatobiliary scan with no visualization of the biliary tree secondary to severe hepatocellular disease. Acute hep panel 12/09 >> HCV Ab reactive HIV 12/10 >> non reactive 12/10 Urine Na < 10, Urine osmo 257 Hep C viral load 12/11 >> negative  LINES / TUBES: CVL 12/14 >>   CULTURES: U/A 12/09 >> trichomonas Urine 12/09 >> E coli  ANTIBIOTICS: Rocephin 12/09 >> 12/09 Flagyl 12/09 >>  Cipro 12/09 >>    SUBJECTIVE:  afebrile Low UO Denies CP, dyspnea C/o anxiety   VITAL SIGNS: Temp:  [97.3 F (36.3 C)-98.7 F (37.1 C)] 97.4 F (36.3 C) (12/15 0758) Pulse Rate:  [73-99] 80 (12/15 0600) Resp:  [8-20] 11 (12/15 0600) BP: (89-122)/(58-90) 108/76 mmHg (12/15 0600) SpO2:  [94 %-100 %] 99 % (12/15 0600) Weight:  [61.4 kg (135 lb 5.8 oz)-61.7 kg (136 lb 0.4 oz)] 61.4 kg (135 lb 5.8 oz) (12/15 0500) Room air  INTAKE / OUTPUT: Intake/Output     12/14 0701 - 12/15 0700 12/15 0701 - 12/16 0700   P.O. 120    I.V. (mL/kg) 538 (8.8)    IV Piggyback 1800    Total Intake(mL/kg) 2458 (40)    Urine (mL/kg/hr)     Total Output       Net +2458          Urine Occurrence 1 x      PHYSICAL EXAMINATION: General: no distress Neuro:   Alert, normal strength, follows commands, no asterexis HEENT:  Jaundice, no sinus tenderness Cardiovascular:  Regular, no murmur Lungs:  Decreased BS at bases, no wheeze Abdomen:  Distended, + fluid wave, non tender Musculoskeletal:  1+ leg edema Skin:  jaundice  LABS:  CBC  Recent Labs Lab 02/16/13 1851 02/17/13 0545  WBC 15.5* 37.2*  HGB 12.7 12.7  HCT 35.2* 34.3*  PLT 181 156   Coag's  Recent Labs Lab 16-Feb-2013 1950 02/13/13 0605  APTT 29 33  INR 1.45 1.70*   BMET  Recent Labs Lab 02/15/13 0625 02/16/13 1005 02/17/13 0425  NA 118* 115* 112*  K 4.2 4.8 4.5  CL 88* 85* 83*  CO2 16* 14* 12*  BUN 40* 54* 63*  CREATININE 1.54* 2.23* 2.35*  GLUCOSE 97 112* 153*   Electrolytes  Recent Labs Lab 02/15/13 0625 02/16/13 1005 02/17/13 0425  CALCIUM 7.6* 7.4* 7.2*  MG  --   --  2.4  PHOS  --   --  5.6*   Sepsis Markers  Recent Labs Lab February 16, 2013 2359  LATICACIDVEN 1.5   ABG No results found for this basename: PHART, PCO2ART, PO2ART,  in the last 168 hours Liver Enzymes  Recent Labs Lab 02/15/13 0625 02/16/13 1005 02/17/13 0425  AST 202* 210* 181*  ALT 106* 116* 112*  ALKPHOS 170* 160* 149*  BILITOT 25.5* 25.0* 28.9*  ALBUMIN 1.6* 1.5* 2.1*   Cardiac Enzymes No results found for this basename: TROPONINI, PROBNP,  in the last 168 hours Glucose No results found for this basename: GLUCAP,  in the last 168 hours  Imaging Dg Chest Port 1 View  02/16/2013   CLINICAL DATA:  Central line placement, hypertension  EXAM: PORTABLE CHEST - 1 VIEW  COMPARISON:  Portable exam 1825 hr compared to 02-20-13  FINDINGS: Right jugular central venous catheter with tip projecting over the cavoatrial junction.  Normal heart size, mediastinal contours and pulmonary vascularity.  Bibasilar atelectasis.  Chronic elevation right diaphragm.  No pulmonary infiltrate, pleural effusion or pneumothorax.  Bones unremarkable.  IMPRESSION: No pneumothorax following right  jugular line placement.  Bibasilar atelectasis.   Electronically Signed   By: Ulyses Southward M.D.   On: 02/16/2013 18:53     ASSESSMENT / PLAN:  PULMONARY A: Passive atelectasis. Tobacco abuse. P:   -flutter valve -monitor oxygenation -smoking cessation; continue nicoderm  CARDIOVASCULAR A: Shock ? septic P:  -levophed gtt  RENAL A:   Probable heptorenal syndrome. Hyponatremia. P:   -start albumin, levophed per renal -f/u renal fx, urine outpt, electrolytes -consider Octreotide/ midodrine  GASTROINTESTINAL A:   ETOH cirrhosis with jaundice. P:   -f/u LFT's -protonix for SUP -GI following  HEMATOLOGIC A:   Leucocytosis ? steroids P:  -f/u CBC, coag's -SCD for DVT prevention  INFECTIOUS A:   E coli UTI. Trichomonas. P:   -continue cipro, flagyl  ENDOCRINE A:  Cortisol ok, TSH high ? hypothyroid  P:  Start low dose levothyroxine  NEUROLOGIC A:   ETOH abuse >> last drink reported to be 10 days prior to admission. 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 Palliative care input will be sought for goals of care   Care during the described time interval was provided by me and/or other providers on the critical care team.  I have reviewed this patient's available data, including medical history, events of note, physical examination and test results as part of my evaluation  CC time x 35 m  Cyril Mourning MD. Tonny Bollman. [place] [facility] Pager [phone] If no response call [phone]    02/17/2013, 9:23 AM

## 2013-02-17 NOTE — Progress Notes (Signed)
UR completed.  Faron Tudisco, RN BSN [facility] CCM Trauma/Neuro ICU Case Manager [phone]  

## 2013-02-18 LAB — COMPREHENSIVE METABOLIC PANEL
AST: 204 U/L — ABNORMAL HIGH (ref 0–37)
Albumin: 2.8 g/dL — ABNORMAL LOW (ref 3.5–5.2)
Alkaline Phosphatase: 90 U/L (ref 39–117)
BUN: 57 mg/dL — ABNORMAL HIGH (ref 6–23)
CO2: 15 mEq/L — ABNORMAL LOW (ref 19–32)
Chloride: 87 mEq/L — ABNORMAL LOW (ref 96–112)
GFR calc non Af Amer: 29 mL/min — ABNORMAL LOW (ref >90)
Potassium: 3.6 mEq/L (ref 3.5–5.1)
Total Bilirubin: 27.3 mg/dL (ref 0.3–1.2)
Total Protein: 4.5 g/dL — ABNORMAL LOW (ref 6.0–8.3)

## 2013-02-18 MED ORDER — BIOTENE DRY MOUTH MT LIQD
15.0000 mL | Freq: Two times a day (BID) | OROMUCOSAL | Status: DC
  Administered 2013-02-18 – 2013-02-24 (×8): 15 mL via OROMUCOSAL

## 2013-02-18 MED ORDER — STERILE WATER FOR INJECTION IV SOLN: 3.0000 | INTRAVENOUS | Status: DC

## 2013-02-18 MED ORDER — LACTULOSE 10 GM/15ML PO SOLN
20.0000 g | Freq: Two times a day (BID) | ORAL | Status: DC
  Administered 2013-02-18 (×2): 20 g via ORAL
  Filled 2013-02-18 (×4): qty 30

## 2013-02-18 MED ORDER — SODIUM CHLORIDE 0.9 % IV SOLN
INTRAVENOUS | Status: DC
  Administered 2013-02-18: 06:00:00 via INTRAVENOUS

## 2013-02-18 MED ORDER — STERILE WATER FOR INJECTION IV SOLN
INTRAVENOUS | Status: DC
  Administered 2013-02-18 – 2013-02-23 (×5): via INTRAVENOUS
  Filled 2013-02-18 (×8): qty 850

## 2013-02-18 NOTE — Progress Notes (Signed)
Subjective: Feeling uncomfortable.  Objective: Vital signs in last 24 hours: Temp:  [97.4 F (36.3 C)-98.5 F (36.9 C)] 98 F (36.7 C) (12/16 0352) Pulse Rate:  [68-134] 91 (12/16 0600) Resp:  [8-20] 20 (12/16 0600) BP: (106-142)/(65-94) 119/73 mmHg (12/16 0600) SpO2:  [91 %-100 %] 91 % (12/16 0600) Weight:  [143 lb 15.4 oz (65.3 kg)] 143 lb 15.4 oz (65.3 kg) (12/16 0500) Last BM Date: 02/17/13  Intake/Output from previous day: 12/15 0701 - 12/16 0700 In: 2674.4 [P.O.:20; I.V.:1016.9; IV Piggyback:1637.5] Out: 1326 [Urine:1325; Stool:1] Intake/Output this shift: Total I/O In: 661.5 [I.V.:461.5; IV Piggyback:200] Out: 785 [Urine:785]  General appearance: alert and weak, uncomfortable appearing GI: soft, distended with ascites, feels a bit tighter Extremities: mild asterixis  Lab Results:  Recent Labs  02/17/13 0545  WBC 37.2*  HGB 12.7  HCT 34.3*  PLT 156   BMET  Recent Labs  02/16/13 1005 02/17/13 0425  NA 115* 112*  K 4.8 4.5  CL 85* 83*  CO2 14* 12*  GLUCOSE 112* 153*  BUN 54* 63*  CREATININE 2.23* 2.35*  CALCIUM 7.4* 7.2*   LFT  Recent Labs  02/17/13 0425  PROT 4.7*  ALBUMIN 2.1*  AST 181*  ALT 112*  ALKPHOS 149*  BILITOT 28.9*   PT/INR No results found for this basename: LABPROT, INR,  in the last 72 hours Hepatitis Panel No results found for this basename: HEPBSAG, HCVAB, HEPAIGM, HEPBIGM,  in the last 72 hours C-Diff No results found for this basename: CDIFFTOX,  in the last 72 hours Fecal Lactopherrin No results found for this basename: FECLLACTOFRN,  in the last 72 hours  Studies/Results: Dg Chest Port 1 View  02/16/2013   CLINICAL DATA:  Central line placement, hypertension  EXAM: PORTABLE CHEST - 1 VIEW  COMPARISON:  Portable exam 1825 hr compared to 02/10/2013  FINDINGS: Right jugular central venous catheter with tip projecting over the cavoatrial junction.  Normal heart size, mediastinal contours and pulmonary vascularity.   Bibasilar atelectasis.  Chronic elevation right diaphragm.  No pulmonary infiltrate, pleural effusion or pneumothorax.  Bones unremarkable.  IMPRESSION: No pneumothorax following right jugular line placement.  Bibasilar atelectasis.   Electronically Signed   By: Ulyses Southward M.D.   On: 02/16/2013 18:53    Medications:  Scheduled: . albumin human  12.5 g Intravenous Q6H  . ciprofloxacin  400 mg Intravenous Q24H  . levothyroxine  50 mcg Oral QAC breakfast  . metronidazole  500 mg Intravenous Q12H  . midodrine  5 mg Oral TID WC  . nicotine  21 mg Transdermal Q24H  . sodium chloride  3 mL Intravenous Q12H   Continuous: . sodium chloride 50 mL/hr at 02/18/13 0600  . norepinephrine (LEVOPHED) Adult infusion 10 mcg/min (02/18/13 0600)  . vasopressin (PITRESSIN) infusion - *FOR SHOCK* 0.03 Units/min (02/18/13 0600)    Assessment/Plan: 1) Decompensated cirrhosis. 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 3) Increasing ascites.   The patient's ascites is increasing with the IV fluids, as expected.  I discussed with her the risks and benefits of a paracentesis.  The paracentesis can worsen or improve the renal function.  It can improve the renal function by decompressing any pressure on the renal vasculature.  She does not want to take the chance of worsening her renal function.  This AM's blood work is pending.  Prognosis remains poor and I do not expect her to survive this visit.  Plan: 1) Continue with the current supportive care.   LOS: 7 days  Dennison Mcdaid D 02/18/2013, 6:53 AM

## 2013-02-18 NOTE — Progress Notes (Signed)
PULMONARY  / CRITICAL CARE MEDICINE  Name: Teresa House MRN: [id number] DOB: [birth year]    ADMISSION DATE:  02/15/13 CONSULTATION DATE:  02/16/2013  REFERRING MD :  Kathlen Mody  CHIEF COMPLAINT:  Yellow  BRIEF PATIENT DESCRIPTION:  47 yo female smoker with cirrhosis from ETOH admitted with progressive jaundice, bloating, and abdominal discomfort.  She developed renal failure with concern for hepatorenal syndrome.  She was transferred to ICU for levophed therapy, and PCCM assumed care.  SIGNIFICANT EVENTS: 12/10 Paracentesis >> 2.4 liters, GI consulted 12/14 Renal consulted, transfer to ICU for levophed therapy  STUDIES:  12/09 Abd u/s >> cirrhosis, ascites, normal kidneys, gallbladder sludge 12/09 Heptobiliary scan >> Indeterminate hepatobiliary scan with no visualization of the biliary tree secondary to severe hepatocellular disease. Acute hep panel 12/09 >> HCV Ab reactive HIV 12/10 >> non reactive 12/10 Urine Na < 10, Urine osmo 257 Hep C viral load 12/11 >> negative  LINES / TUBES: CVL 12/14 >>   CULTURES: U/A 12/09 >> trichomonas Urine 12/09 >> E coli  ANTIBIOTICS: Rocephin 12/09 >> 12/09 Flagyl 12/09 >>  Cipro 12/09 >>    SUBJECTIVE:  Afebrile, mild dyspnea improved UO Denies CP, dyspnea C/o anxiety   VITAL SIGNS: Temp:  [97.6 F (36.4 C)-98.5 F (36.9 C)] 97.6 F (36.4 C) (12/16 0803) Pulse Rate:  [68-134] 76 (12/16 0900) Resp:  [8-20] 14 (12/16 0900) BP: (106-142)/(65-94) 123/76 mmHg (12/16 0900) SpO2:  [91 %-100 %] 96 % (12/16 0900) Weight:  [65.3 kg (143 lb 15.4 oz)] 65.3 kg (143 lb 15.4 oz) (12/16 0500) Room air  INTAKE / OUTPUT: Intake/Output     12/15 0701 - 12/16 0700 12/16 0701 - 12/17 0700   P.O. 20    I.V. (mL/kg) 1113.4 (17.1) 193 (3)   IV Piggyback 1637.5    Total Intake(mL/kg) 2770.9 (42.4) 193 (3)   Urine (mL/kg/hr) 1325 (0.8) 150 (0.9)   Stool 1 (0)    Total Output 1326 150   Net +1444.9 +43        Stool Occurrence 2 x       PHYSICAL EXAMINATION: General: no distress Neuro:  Alert, normal strength, follows commands, no asterexis HEENT:  Jaundice, no sinus tenderness Cardiovascular:  Regular, no murmur Lungs:  Decreased BS at bases, no wheeze Abdomen:  Distended, + fluid wave, non tender Musculoskeletal:  1+ leg edema Skin:  jaundice  LABS:  CBC  Recent Labs Lab 2013-02-15 1851 02/17/13 0545  WBC 15.5* 37.2*  HGB 12.7 12.7  HCT 35.2* 34.3*  PLT 181 156   Coag's  Recent Labs Lab Feb 15, 2013 1950 02/13/13 0605  APTT 29 33  INR 1.45 1.70*   BMET  Recent Labs Lab 02/16/13 1005 02/17/13 0425 02/18/13 0530  NA 115* 112* 118*  K 4.8 4.5 3.6  CL 85* 83* 87*  CO2 14* 12* 15*  BUN 54* 63* 57*  CREATININE 2.23* 2.35* 2.01*  GLUCOSE 112* 153* 145*   Electrolytes  Recent Labs Lab 02/16/13 1005 02/17/13 0425 02/18/13 0530  CALCIUM 7.4* 7.2* 7.0*  MG  --  2.4  --   PHOS  --  5.6*  --    Sepsis Markers  Recent Labs Lab 02-15-13 2359  LATICACIDVEN 1.5   ABG No results found for this basename: PHART, PCO2ART, PO2ART,  in the last 168 hours Liver Enzymes  Recent Labs Lab 02/16/13 1005 02/17/13 0425 02/18/13 0530  AST 210* 181* 204*  ALT 116* 112* 103*  ALKPHOS 160* 149* 90  BILITOT  25.0* 28.9* 27.3*  ALBUMIN 1.5* 2.1* 2.8*   Cardiac Enzymes No results found for this basename: TROPONINI, PROBNP,  in the last 168 hours Glucose No results found for this basename: GLUCAP,  in the last 168 hours  Imaging Dg Chest Port 1 View  02/16/2013   CLINICAL DATA:  Central line placement, hypertension  EXAM: PORTABLE CHEST - 1 VIEW  COMPARISON:  Portable exam 1825 hr compared to 03-10-13  FINDINGS: Right jugular central venous catheter with tip projecting over the cavoatrial junction.  Normal heart size, mediastinal contours and pulmonary vascularity.  Bibasilar atelectasis.  Chronic elevation right diaphragm.  No pulmonary infiltrate, pleural effusion or pneumothorax.  Bones  unremarkable.  IMPRESSION: No pneumothorax following right jugular line placement.  Bibasilar atelectasis.   Electronically Signed   By: Ulyses Southward M.D.   On: 02/16/2013 18:53     ASSESSMENT / PLAN:  PULMONARY A: Passive atelectasis. Tobacco abuse. P:   -flutter valve -monitor oxygenation -smoking cessation; continue nicoderm  CARDIOVASCULAR A: Shock ? 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 Hyponatremia. P:   -ct albumin, levophed per renal -f/u renal fx, urine outpt, electrolytes -consider Octreotide/ midodrine  GASTROINTESTINAL A:   ETOH cirrhosis with jaundice. P:   -f/u LFT's -protonix for SUP -GI following, agree with defer paracentesis -Is she a TIPS candidate? -start lactulose  HEMATOLOGIC A:   Leucocytosis ? steroids P:  -f/u CBC, coag's -SCD for DVT prevention  INFECTIOUS A:   E coli UTI. Trichomonas. P:   -continue cipro, flagyl  ENDOCRINE A:  Cortisol ok, TSH high ? hypothyroid  P:  Start low dose levothyroxine  NEUROLOGIC A:   ETOH abuse >> last drink reported to be 10 days prior to admission. 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 Palliative care input for goals of care   Care during the described time interval was provided by me and/or other providers on the critical care team.  I have reviewed this patient's available data, including medical history, events of note, physical examination and test results as part of my evaluation  CC time x 35 m  Cyril Mourning MD. Tonny Bollman. [facility] Pager [phone] If no response call [phone]    02/18/2013, 9:40 AM

## 2013-02-18 NOTE — Progress Notes (Signed)
CRITICAL VALUE ALERT  Critical value received:  Na 118/ Bilirubin 27.3  Date of notification:  02/18/13  Time of notification:  0800  Critical value read back: yes  Nurse who received alert:  Amie Critchley, RN  MD notified (1st page):  Dr. Vassie Loll (in person)  Time of first page:  In person 0830  MD notified (2nd page):  Time of second page:  Responding MD:    Time MD responded:

## 2013-02-18 NOTE — Consult Note (Signed)
Patient Teresa House      DOB: 09-Mar-1965      ONG:[id number]     Consult Note from the Palliative Medicine Team at [facility]    Consult Requested by: Dr Craige Cotta     PCP: No PCP Per Patient Reason for Consultation: Clarification of GOC and options    Phone Number:[phone]  Assessment of patients Current state: 47 yo female with worsening jaundice, abdominal pain, elevated LFTs, CT demonstrated cirrhosis/ascites, admitted through the ER.   Presently in ICU for levophed therapy with concern for hepatorenal syndrome.  Palliative to support holistically and help navigate options   Consult is for review of medical treatment options, clarification of goals of care and end of life issues, disposition and options, and symptom recommendation.  This NP Lorinda Creed reviewed medical records, received report from team, assessed the patient and then meet at the patient's bedside along with her SO Trixie Rude  to discuss diagnosis prognosis, GOC,  and options.   A detailed discussion was had today regarding advanced directives.  Concepts specific to code status, artifical feeding and hydration, continued IV antibiotics and rehospitalization was had.  The difference between a aggressive medical intervention path  and a palliative comfort care path for this patient at this time was had.  Values and goals of care important to patient and family were attempted to be elicited.  Concept of Hospice and Palliative Care were discussed   Questions and concerns addressed.  Hard Choices booklet left for review. Family encouraged to call with questions or concerns.  PMT will continue to support holistically.    Goals of Care:   1.  Code Status: Full code   2. Scope of Treatment:  Continue with all available and offered medical interventions to prolong life.  Patient is hopeful to return to baseline.   4. Disposition: Hopeful to return home when medically stable.  May ned to consider SNF for  rehabilitation    3. Symptom Management:   1.  Weakness/Fatigue: Continue medical management     4. Psychosocial:  Emotional support offered to patient and her SO.  They both verbalized an understanding of the seriousness of situation.    5. Spiritual: Decline chaplain support at this time    Brief HPI: 47 yo female with worsening jaundice, abdominal pain, elevated LFTs, CT demonstrated cirrhosis/ascites, admitted through the ER.   Presently in ICU for levophed therapy with concern for hepatorenal syndrome.  Palliative to support holistically and help navigate options     ROS: weakness, fatigue, abdominal discomfort    PMH:  Past Medical History  Diagnosis Date  . Hypertension      PSH: Past Surgical History  Procedure Laterality Date  . Ankle surgry     I have reviewed the FH and SH and  If appropriate update it with new information. Allergies  Allergen Reactions  . Penicillins Other (See Comments)    Childhood reaction    Scheduled Meds: . albumin human  12.5 g Intravenous Q6H  . ciprofloxacin  400 mg Intravenous Q24H  . lactulose  20 g Oral BID  . levothyroxine  50 mcg Oral QAC breakfast  . metronidazole  500 mg Intravenous Q12H  . midodrine  5 mg Oral TID WC  . nicotine  21 mg Transdermal Q24H  . sodium chloride  3 mL Intravenous Q12H   Continuous Infusions: . sodium chloride 50 mL/hr at 02/18/13 0600  . norepinephrine (LEVOPHED) Adult infusion 10 mcg/min (02/18/13 0600)  . vasopressin (  PITRESSIN) infusion - *FOR SHOCK* 0.03 Units/min (02/18/13 0600)   PRN Meds:.LORazepam, morphine injection, ondansetron (ZOFRAN) IV, ondansetron    BP 122/74  Pulse 69  Temp(Src) 97.6 F (36.4 C) (Oral)  Resp 11  Ht 5\' 6"  (1.676 m)  Wt 65.3 kg (143 lb 15.4 oz)  BMI 23.25 kg/m2  SpO2 95%   PPS: 40 %   Intake/Output Summary (Last 24 hours) at 02/18/13 1145 Last data filed at 02/18/13 0900  Gross per 24 hour  Intake 2390.08 ml  Output   1366 ml  Net  1024.08 ml    Physical Exam:  General: ill appearing, NAD, thin and frail HEENT:  Mm, no exudate, + temporal muscle wasting Chest:  Decreased in bases CTA CVS: RRR Abdomen:  distended firm,tender,  distant BS Ext: BLE trace edema Neuro: alert and oriented X3  Labs: CBC    Component Value Date/Time   WBC 37.2* 02/17/2013 0545   RBC 3.28* 02/17/2013 0545   RBC 4.04 07/15/2008 1248   HGB 12.7 02/17/2013 0545   HCT 34.3* 02/17/2013 0545   PLT 156 02/17/2013 0545   MCV 104.6* 02/17/2013 0545   MCH 38.7* 02/17/2013 0545   MCHC 37.0* 02/17/2013 0545   RDW 15.1 02/17/2013 0545   LYMPHSABS 0.8 2013/03/08 1851   MONOABS 1.1* 03-08-2013 1851   EOSABS 0.1 Mar 08, 2013 1851   BASOSABS 0.0 03/08/13 1851    BMET    Component Value Date/Time   NA 118* 02/18/2013 0530   K 3.6 02/18/2013 0530   CL 87* 02/18/2013 0530   CO2 15* 02/18/2013 0530   GLUCOSE 145* 02/18/2013 0530   BUN 57* 02/18/2013 0530   CREATININE 2.01* 02/18/2013 0530   CALCIUM 7.0* 02/18/2013 0530   GFRNONAA 29* 02/18/2013 0530   GFRAA 33* 02/18/2013 0530    CMP     Component Value Date/Time   NA 118* 02/18/2013 0530   K 3.6 02/18/2013 0530   CL 87* 02/18/2013 0530   CO2 15* 02/18/2013 0530   GLUCOSE 145* 02/18/2013 0530   BUN 57* 02/18/2013 0530   CREATININE 2.01* 02/18/2013 0530   CALCIUM 7.0* 02/18/2013 0530   PROT 4.5* 02/18/2013 0530   ALBUMIN 2.8* 02/18/2013 0530   AST 204* 02/18/2013 0530   ALT 103* 02/18/2013 0530   ALKPHOS 90 02/18/2013 0530   BILITOT 27.3* 02/18/2013 0530   GFRNONAA 29* 02/18/2013 0530   GFRAA 33* 02/18/2013 0530     Time In Time Out Total Time Spent with Patient Total Overall Time  1200 1330 80 min 90 min    Greater than 50%  of this time was spent counseling and coordinating care related to the above assessment and plan.  Lorinda Creed NP  Palliative Medicine Team Team Phone # [phone] Pager [phone]  Discussed with Dr Vassie Loll

## 2013-02-18 NOTE — Progress Notes (Signed)
Patient ID: Teresa House, female   DOB: 1965-05-24, 47 y.o.   MRN: [id number] S:No new complaints O:BP 128/80  Pulse 71  Temp(Src) 97.6 F (36.4 C) (Oral)  Resp 13  Ht 5\' 6"  (1.676 m)  Wt 65.3 kg (143 lb 15.4 oz)  BMI 23.25 kg/m2  SpO2 95%  Intake/Output Summary (Last 24 hours) at 02/18/13 1337 Last data filed at 02/18/13 1201  Gross per 24 hour  Intake 1693.33 ml  Output   1430 ml  Net 263.33 ml   Intake/Output: I/O last 3 completed shifts: In: 4706.4 [P.O.:20; I.V.:1648.9; IV Piggyback:3037.5] Out: 1526 [Urine:1525; Stool:1]  Intake/Output this shift:  Total I/O In: 293 [I.V.:193; IV Piggyback:100] Out: 350 [Urine:350] Weight change: 3.6 kg (7 lb 15 oz) WUJ:[id number] WF in NAD CVS:no rub Resp:bibasilar crackles NFA:OZHYQMVHQ, +ascites Ext:+edema   Recent Labs Lab 02/25/2013 1851 02/12/13 0300  02/12/13 1111 02/12/13 1655 02/13/13 0605 02/15/13 0625 02/16/13 1005 02/17/13 0425 02/18/13 0530  NA 115* 118*  < > 118* 118* 118* 118* 115* 112* 118*  K 3.5 2.8*  < > 3.1* 3.6 3.9 4.2 4.8 4.5 3.6  CL 83* 86*  < > 87* 87* 89* 88* 85* 83* 87*  CO2 18* 17*  < > 19 18* 18* 16* 14* 12* 15*  GLUCOSE 116* 93  < > 92 94 95 97 112* 153* 145*  BUN 14 14  < > 16 17 20  40* 54* 63* 57*  CREATININE 0.52 0.54  < > 0.63 0.66 0.82 1.54* 2.23* 2.35* 2.01*  ALBUMIN 1.9* 1.7*  --   --   --  1.7* 1.6* 1.5* 2.1* 2.8*  CALCIUM 7.7* 7.3*  < > 7.6* 7.5* 7.4* 7.6* 7.4* 7.2* 7.0*  PHOS  --   --   --   --   --   --   --   --  5.6*  --   AST 144* 140*  --   --   --  188* 202* 210* 181* 204*  ALT 76* 72*  --   --   --  95* 106* 116* 112* 103*  < > = values in this interval not displayed. Liver Function Tests:  Recent Labs Lab 02/16/13 1005 02/17/13 0425 02/18/13 0530  AST 210* 181* 204*  ALT 116* 112* 103*  ALKPHOS 160* 149* 90  BILITOT 25.0* 28.9* 27.3*  PROT 4.6* 4.7* 4.5*  ALBUMIN 1.5* 2.1* 2.8*    Recent Labs Lab 02/10/2013 1851  LIPASE 38    Recent Labs Lab  02/19/2013 1950 02/16/13 1816  AMMONIA 28 27   CBC:  Recent Labs Lab 02/13/2013 1851 02/17/13 0545  WBC 15.5* 37.2*  NEUTROABS 13.5*  --   HGB 12.7 12.7  HCT 35.2* 34.3*  MCV 107.6* 104.6*  PLT 181 156   Cardiac Enzymes: No results found for this basename: CKTOTAL, CKMB, CKMBINDEX, TROPONINI,  in the last 168 hours CBG: No results found for this basename: GLUCAP,  in the last 168 hours  Iron Studies: No results found for this basename: IRON, TIBC, TRANSFERRIN, FERRITIN,  in the last 72 hours Studies/Results: Dg Chest Port 1 View  02/16/2013   CLINICAL DATA:  Central line placement, hypertension  EXAM: PORTABLE CHEST - 1 VIEW  COMPARISON:  Portable exam 1825 hr compared to 02/10/2013  FINDINGS: Right jugular central venous catheter with tip projecting over the cavoatrial junction.  Normal heart size, mediastinal contours and pulmonary vascularity.  Bibasilar atelectasis.  Chronic elevation right diaphragm.  No pulmonary infiltrate, pleural effusion  or pneumothorax.  Bones unremarkable.  IMPRESSION: No pneumothorax following right jugular line placement.  Bibasilar atelectasis.   Electronically Signed   By: Ulyses Southward M.D.   On: 02/16/2013 18:53   . ciprofloxacin  400 mg Intravenous Q24H  . lactulose  20 g Oral BID  . levothyroxine  50 mcg Oral QAC breakfast  . metronidazole  500 mg Intravenous Q12H  . midodrine  5 mg Oral TID WC  . nicotine  21 mg Transdermal Q24H  . sodium chloride  3 mL Intravenous Q12H    BMET    Component Value Date/Time   NA 118* 02/18/2013 0530   K 3.6 02/18/2013 0530   CL 87* 02/18/2013 0530   CO2 15* 02/18/2013 0530   GLUCOSE 145* 02/18/2013 0530   BUN 57* 02/18/2013 0530   CREATININE 2.01* 02/18/2013 0530   CALCIUM 7.0* 02/18/2013 0530   GFRNONAA 29* 02/18/2013 0530   GFRAA 33* 02/18/2013 0530   CBC    Component Value Date/Time   WBC 37.2* 02/17/2013 0545   RBC 3.28* 02/17/2013 0545   RBC 4.04 07/15/2008 1248   HGB 12.7 02/17/2013 0545    HCT 34.3* 02/17/2013 0545   PLT 156 02/17/2013 0545   MCV 104.6* 02/17/2013 0545   MCH 38.7* 02/17/2013 0545   MCHC 37.0* 02/17/2013 0545   RDW 15.1 02/17/2013 0545   LYMPHSABS 0.8 02-28-13 1851   MONOABS 1.1* 2013-02-28 1851   EOSABS 0.1 28-Feb-2013 1851   BASOSABS 0.0 Feb 28, 2013 1851     Assessment/Plan:  1. AKI: possible hepatorenal syndrome vs ischemic ATN in setting of large volume paracentesis and 3rd spacing with decreased renal perfusion. Agree with treatment for HRS for now. Low CVP and receiving bolus of NS which was changed to isotonic bicarb given the development of metabolic acidosis and improvement of Scr.  However ascites is worse.  Intravascularly she is dry but total body volume overloaded. Will give 1/2 liter of bicarb and follow. 2. Hyponatremia- secondary to cirrhosis.  Improved with IVF's 3. Metabolic acidosis- will need to start bicarb drip (will administer slowly given ascites) and may require HD if this worsens and if she and family want more aggressive measures. 4. Painless jaundice- will need to r/o malignancy  5. Decompensated Cirrhosis- secondary to Etoh and Hep C. 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 She is not a liver transplant candidate given her recent Etoh use and agree with palliative care consult to help set goals/limits of care.   Agree with octreotide sq tid today and consider switching levophed to po midodrine 10mg  tid if she improves tomorrow. 6. Dispo- agree with Palliative care meeting.  Teresa House A

## 2013-02-19 DIAGNOSIS — R531 Weakness: Secondary | ICD-10-CM | POA: Diagnosis present

## 2013-02-19 DIAGNOSIS — Z515 Encounter for palliative care: Secondary | ICD-10-CM

## 2013-02-19 LAB — COMPREHENSIVE METABOLIC PANEL
Albumin: 2.5 g/dL — ABNORMAL LOW (ref 3.5–5.2)
BUN: 57 mg/dL — ABNORMAL HIGH (ref 6–23)
Calcium: 7 mg/dL — ABNORMAL LOW (ref 8.4–10.5)
GFR calc Af Amer: 40 mL/min — ABNORMAL LOW (ref >90)
Glucose, Bld: 159 mg/dL — ABNORMAL HIGH (ref 70–99)
Sodium: 120 mEq/L — ABNORMAL LOW (ref 135–145)
Total Protein: 4.2 g/dL — ABNORMAL LOW (ref 6.0–8.3)

## 2013-02-19 LAB — GLUCOSE, CAPILLARY: Glucose-Capillary: 147 mg/dL — ABNORMAL HIGH (ref 70–99)

## 2013-02-19 NOTE — Progress Notes (Signed)
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  She developed renal failure with concern for hepatorenal syndrome.  She was transferred to ICU for levophed therapy, and PCCM assumed care.  SIGNIFICANT EVENTS: 12/10 Paracentesis >> 2.4 liters, GI consulted 12/14 Renal consulted, transfer to ICU for levophed therapy  STUDIES:  12/09 Abd u/s >> cirrhosis, ascites, normal kidneys, gallbladder sludge 12/09 Heptobiliary scan >> Indeterminate hepatobiliary scan with no visualization of the biliary tree secondary to severe hepatocellular disease. Acute hep panel 12/09 >> HCV Ab reactive HIV 12/10 >> non reactive 12/10 Urine Na < 10, Urine osmo 257 Hep C viral load 12/11 >> negative  LINES / TUBES: CVL 12/14 >>   CULTURES: U/A 12/09 >> trichomonas Urine 12/09 >> E coli  ANTIBIOTICS: Rocephin 12/09 >> 12/09 Flagyl 12/09 >>  Cipro 12/09 >>    SUBJECTIVE:  Denies CP, dyspnea Afebrile, mild dyspnea improved UO C/o anxiety   VITAL SIGNS: Temp:  [97.3 F (36.3 C)-98.4 F (36.9 C)] 98.4 F (36.9 C) (12/17 0803) Pulse Rate:  [69-87] 69 (12/17 0900) Resp:  [8-19] 9 (12/17 0900) BP: (106-135)/(60-88) 116/79 mmHg (12/17 0900) SpO2:  [92 %-97 %] 97 % (12/17 0900) Room air  INTAKE / OUTPUT: Intake/Output     12/16 0701 - 12/17 0700 12/17 0701 - 12/18 0700   P.O. 120    I.V. (mL/kg) 3120.2 (47.8) 293 (4.5)   IV Piggyback 300    Total Intake(mL/kg) 3540.2 (54.2) 293 (4.5)   Urine (mL/kg/hr) 1155 (0.7) 175 (0.8)   Stool     Total Output 1155 175   Net +2385.2 +118        Stool Occurrence 6 x 1 x     PHYSICAL EXAMINATION: General: no distress Neuro:  Alert, normal  strength, follows commands, no asterexis HEENT:  Jaundice, no sinus tenderness Cardiovascular:  Regular, no murmur Lungs:  Decreased BS at bases, no wheeze Abdomen:  Distended, + fluid wave, non tender Musculoskeletal:  1+ leg edema Skin:  jaundice  LABS:  CBC  Recent Labs Lab 02/17/13 0545  WBC 37.2*  HGB 12.7  HCT 34.3*  PLT 156   Coag's  Recent Labs Lab 02/13/13 0605  APTT 33  INR 1.70*   BMET  Recent Labs Lab 02/17/13 0425 02/18/13 0530 02/19/13 0500  NA 112* 118* 120*  K 4.5 3.6 3.0*  CL 83* 87* 88*  CO2 12* 15* 16*  BUN 63* 57* 57*  CREATININE 2.35* 2.01* 1.73*  GLUCOSE 153* 145* 159*   Electrolytes  Recent Labs Lab 02/17/13 0425 02/18/13 0530 02/19/13 0500  CALCIUM 7.2* 7.0* 7.0*  MG 2.4  --   --   PHOS 5.6*  --   --    Sepsis Markers No results found for this basename: LATICACIDVEN, PROCALCITON, O2SATVEN,  in the last 168 hours ABG No results found for this basename: PHART, PCO2ART, PO2ART,  in the last 168 hours Liver Enzymes  Recent Labs Lab 02/17/13 0425 02/18/13 0530 02/19/13 0500  AST 181* 204* 287*  ALT 112* 103* 153*  ALKPHOS 149* 90 100  BILITOT 28.9* 27.3* 29.3*  ALBUMIN 2.1* 2.8* 2.5*   Cardiac Enzymes No results found for this basename: TROPONINI, PROBNP,  in  the last 168 hours Glucose No results found for this basename: GLUCAP,  in the last 168 hours  Imaging No results found.   ASSESSMENT / PLAN:  PULMONARY A: Passive atelectasis. Tobacco abuse. P:   -flutter valve -monitor oxygenation -smoking cessation; continue nicoderm  CARDIOVASCULAR A: Shock ? septic P:  -levophed gtt, vaso gtt  maintain MAP 80 range -WIl ct this starategy since seems to have worked  RENAL A:   Probable heptorenal syndrome. Hyponatremia. P:   -dc albumin -f/u renal fx, urine outpt, electrolytes -ct midodrine  GASTROINTESTINAL A:   ETOH cirrhosis with jaundice. P:   -f/u LFT's -protonix for SUP -GI following,  agree with defer paracentesis -Is she a TIPS candidate? -dc lactulose -restart only if encephalopathy  HEMATOLOGIC A:   Leucocytosis ? steroids P:  -f/u CBC, coag's -SCD for DVT prevention  INFECTIOUS A:   E coli UTI. Trichomonas. P:   -continue cipro, flagyl  ENDOCRINE A:  Cortisol ok, TSH high ? hypothyroid  P:  Start low dose levothyroxine  NEUROLOGIC A:   ETOH abuse >> last drink reported to be 10 days prior to admission. 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 Palliative care input appreciated   Care during the described time interval was provided by me and/or other providers on the critical care team.  I have reviewed this patient's available data, including medical history, events of note, physical examination and test results as part of my evaluation  CC time x 31 m  Cyril Mourning MD. Tonny Bollman. [facility] Pager [phone] If no response call [phone]    02/19/2013, 10:15 AM

## 2013-02-19 NOTE — Progress Notes (Signed)
Subjective: No acute events.  Feeling okay.  Objective: Vital signs in last 24 hours: Temp:  [97.3 F (36.3 C)-97.6 F (36.4 C)] 97.4 F (36.3 C) (12/17 0316) Pulse Rate:  [69-96] 71 (12/17 0700) Resp:  [8-26] 8 (12/17 0700) BP: (106-135)/(61-88) 119/71 mmHg (12/17 0700) SpO2:  [90 %-97 %] 96 % (12/17 0700) Last BM Date: 02/18/13  Intake/Output from previous day: 12/16 0701 - 12/17 0700 In: 3393.7 [P.O.:120; I.V.:2973.7; IV Piggyback:300] Out: 1155 [Urine:1155] Intake/Output this shift:    General appearance: alert and no distress GI: distended with ascites, non tense  Lab Results:  Recent Labs  02/17/13 0545  WBC 37.2*  HGB 12.7  HCT 34.3*  PLT 156   BMET  Recent Labs  02/17/13 0425 02/18/13 0530 02/19/13 0500  NA 112* 118* 120*  K 4.5 3.6 3.0*  CL 83* 87* 88*  CO2 12* 15* 16*  GLUCOSE 153* 145* 159*  BUN 63* 57* 57*  CREATININE 2.35* 2.01* 1.73*  CALCIUM 7.2* 7.0* 7.0*   LFT  Recent Labs  02/19/13 0500  PROT 4.2*  ALBUMIN 2.5*  AST 287*  ALT 153*  ALKPHOS 100  BILITOT 29.3*   PT/INR No results found for this basename: LABPROT, INR,  in the last 72 hours Hepatitis Panel No results found for this basename: HEPBSAG, HCVAB, HEPAIGM, HEPBIGM,  in the last 72 hours C-Diff No results found for this basename: CDIFFTOX,  in the last 72 hours Fecal Lactopherrin No results found for this basename: FECLLACTOFRN,  in the last 72 hours  Studies/Results: No results found.  Medications:  Scheduled: . antiseptic oral rinse  15 mL Mouth Rinse BID  . ciprofloxacin  400 mg Intravenous Q24H  . lactulose  20 g Oral BID  . levothyroxine  50 mcg Oral QAC breakfast  . metronidazole  500 mg Intravenous Q12H  . midodrine  5 mg Oral TID WC  . nicotine  21 mg Transdermal Q24H  . sodium chloride  3 mL Intravenous Q12H   Continuous: . sodium chloride 50 mL/hr at 02/19/13 0600  . norepinephrine (LEVOPHED) Adult infusion 10 mcg/min (02/19/13 0600)  .  sodium  bicarbonate 150 mEq in sterile water 1000 mL infusion 50 mL/hr at 02/19/13 0600  . vasopressin (PITRESSIN) infusion - *FOR SHOCK* 0.03 Units/min (02/19/13 0600)    Assessment/Plan: 1) Decompensated cirrhosis. 2) Renal failure improved - HRS versus ischemic ATN 3) ETOH abuse.   The patient is producing more urine over the past two days and her creatinine has decreased.  These are both very encouraging signs, however, her prognosis still remains poor.  Additionally, she does NOT have HCV.  Her HCV RNA is negative.    Plan: 1) Continue with pressors. 2) Hold off on any paracentesis.  I cannot discern if it will hurt or harm her clinical status. 3) Hold lactulose for now.  If encephalopathy is an issue Xifaxan would be a better choice in that she is mostly in the bed at this time.  LOS: 8 days   Yaakov Saindon D 02/19/2013, 7:46 AM

## 2013-02-19 NOTE — Progress Notes (Signed)
Chaplain responded to a request to assist with an Advanced Directive.  Pt requested for Chaplain to wait until her husband was present.  Staff will notify chaplain when husband arrives and pt is ready.  Pt was alert and conversational.  She expressed a sense of good news about her progress toward moving out of the bed.   Chaplain explored other emotional / spiritual needs with patient.   Will follow as needed.     02/19/13 1400  Clinical Encounter Type  Visited With Patient  Visit Type Follow-up;Spiritual support  Referral From Nurse  Spiritual Encounters  Spiritual Needs Emotional  Stress Factors  Patient Stress Factors None identified  Family Stress Factors None identified  [place], [place],  [address]

## 2013-02-19 NOTE — Progress Notes (Signed)
Patient ID: Teresa House, female   DOB: 02-21-1966, 47 y.o.   MRN: [id number] S:no new complaints O:BP 116/79  Pulse 69  Temp(Src) 98.4 F (36.9 C) (Oral)  Resp 9  Ht 5\' 6"  (1.676 m)  Wt 65.3 kg (143 lb 15.4 oz)  BMI 23.25 kg/m2  SpO2 97%  Intake/Output Summary (Last 24 hours) at 02/19/13 1132 Last data filed at 02/19/13 0900  Gross per 24 hour  Intake 3347.18 ml  Output   1180 ml  Net 2167.18 ml   Intake/Output: I/O last 3 completed shifts: In: 4298.2 [P.O.:120; I.V.:3678.2; IV Piggyback:500] Out: 1940 [Urine:1940]  Intake/Output this shift:  Total I/O In: 293 [I.V.:293] Out: 175 [Urine:175] Weight change:  NGE:XBMWUXLKG CVS:no rub Resp:decreased bs MWN:UUVOZDGUY with ascites Ext:2+ edema   Recent Labs Lab 02/12/13 1655 02/13/13 0605 02/15/13 0625 02/16/13 1005 02/17/13 0425 02/18/13 0530 02/19/13 0500  NA 118* 118* 118* 115* 112* 118* 120*  K 3.6 3.9 4.2 4.8 4.5 3.6 3.0*  CL 87* 89* 88* 85* 83* 87* 88*  CO2 18* 18* 16* 14* 12* 15* 16*  GLUCOSE 94 95 97 112* 153* 145* 159*  BUN 17 20 40* 54* 63* 57* 57*  CREATININE 0.66 0.82 1.54* 2.23* 2.35* 2.01* 1.73*  ALBUMIN  --  1.7* 1.6* 1.5* 2.1* 2.8* 2.5*  CALCIUM 7.5* 7.4* 7.6* 7.4* 7.2* 7.0* 7.0*  PHOS  --   --   --   --  5.6*  --   --   AST  --  188* 202* 210* 181* 204* 287*  ALT  --  95* 106* 116* 112* 103* 153*   Liver Function Tests:  Recent Labs Lab 02/17/13 0425 02/18/13 0530 02/19/13 0500  AST 181* 204* 287*  ALT 112* 103* 153*  ALKPHOS 149* 90 100  BILITOT 28.9* 27.3* 29.3*  PROT 4.7* 4.5* 4.2*  ALBUMIN 2.1* 2.8* 2.5*   No results found for this basename: LIPASE, AMYLASE,  in the last 168 hours  Recent Labs Lab 02/16/13 1816  AMMONIA 27   CBC:  Recent Labs Lab 02/17/13 0545  WBC 37.2*  HGB 12.7  HCT 34.3*  MCV 104.6*  PLT 156   Cardiac Enzymes: No results found for this basename: CKTOTAL, CKMB, CKMBINDEX, TROPONINI,  in the last 168 hours CBG: No results found for this  basename: GLUCAP,  in the last 168 hours  Iron Studies: No results found for this basename: IRON, TIBC, TRANSFERRIN, FERRITIN,  in the last 72 hours Studies/Results: No results found. Marland Kitchen antiseptic oral rinse  15 mL Mouth Rinse BID  . ciprofloxacin  400 mg Intravenous Q24H  . levothyroxine  50 mcg Oral QAC breakfast  . metronidazole  500 mg Intravenous Q12H  . midodrine  5 mg Oral TID WC  . nicotine  21 mg Transdermal Q24H  . sodium chloride  3 mL Intravenous Q12H    BMET    Component Value Date/Time   NA 120* 02/19/2013 0500   K 3.0* 02/19/2013 0500   CL 88* 02/19/2013 0500   CO2 16* 02/19/2013 0500   GLUCOSE 159* 02/19/2013 0500   BUN 57* 02/19/2013 0500   CREATININE 1.73* 02/19/2013 0500   CALCIUM 7.0* 02/19/2013 0500   GFRNONAA 35* 02/19/2013 0500   GFRAA 40* 02/19/2013 0500   CBC    Component Value Date/Time   WBC 37.2* 02/17/2013 0545   RBC 3.28* 02/17/2013 0545   RBC 4.04 07/15/2008 1248   HGB 12.7 02/17/2013 0545   HCT 34.3* 02/17/2013 0545  PLT 156 02/17/2013 0545   MCV 104.6* 02/17/2013 0545   MCH 38.7* 02/17/2013 0545   MCHC 37.0* 02/17/2013 0545   RDW 15.1 02/17/2013 0545   LYMPHSABS 0.8 02/22/2013 1851   MONOABS 1.1* 02/27/2013 1851   EOSABS 0.1 02/24/2013 1851   BASOSABS 0.0 02/17/2013 1851     Assessment/Plan:  1. AKI: possible hepatorenal syndrome vs ischemic ATN in setting of large volume paracentesis and 3rd spacing with decreased renal perfusion. Agree with treatment for HRS for now.  1. Low CVP and receiving bolus of NS which was changed to isotonic bicarb given the development of metabolic acidosis and improvement of Scr.  2. ascites is worse after IVF but Scr has improved.  3. Will give 1/2 liter of bicarb again and follow. 2. Hyponatremia- secondary to cirrhosis. Improved with IVF's 3. Metabolic acidosis- responding to bicarb drip (will administer slowly given ascites) and may require HD if this worsens and if she and family want more aggressive  measures. 1. Will give another 500cc today and follow 4. Painless jaundice- will need to r/o malignancy  5. Decompensated Cirrhosis- secondary to Etoh and Hep C. 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 She is not a liver transplant candidate given her recent Etoh use and agree with palliative care consult to help set goals/limits of care. Agree with octreotide sq tid today and consider switching levophed to po midodrine 10mg  tid if she improves tomorrow. 6. Dispo- agree with Palliative care meeting. 7.   [name]

## 2013-02-19 NOTE — Progress Notes (Signed)
When I walked into Ms. Ismael's room she looked at me and said "not today." I told her that I was there just to check on her and see if there were any changes or concerns from her end. She appears more alert and energetic today and says she "feels better." I reinforced that we will continue to stop by and just check on her and follow if she were to have any needs. She then asked what I could help her with so I told her we could help her navigate and search for resources if she improves to the point of being discharged. I also pointed out to her that we can help if things do not go the way we hope and help her navigate that terrain as well. She appeared satisfied with this answer and with palliative following her care. We will continue to follow and support holistically.   Yong Channel, NP Palliative Medicine Team Team Phone # [phone]

## 2013-02-20 LAB — BASIC METABOLIC PANEL
CO2: 19 mEq/L (ref 19–32)
GFR calc Af Amer: 41 mL/min — ABNORMAL LOW (ref >90)
GFR calc non Af Amer: 35 mL/min — ABNORMAL LOW (ref >90)
Potassium: 2.9 mEq/L — ABNORMAL LOW (ref 3.5–5.1)
Sodium: 118 mEq/L — CL (ref 135–145)

## 2013-02-20 LAB — MAGNESIUM: Magnesium: 2.2 mg/dL (ref 1.5–2.5)

## 2013-02-20 LAB — PHOSPHORUS: Phosphorus: 4 mg/dL (ref 2.3–4.6)

## 2013-02-20 MED ORDER — POTASSIUM CHLORIDE CRYS ER 20 MEQ PO TBCR
40.0000 meq | EXTENDED_RELEASE_TABLET | Freq: Once | ORAL | Status: AC
  Administered 2013-02-20: 40 meq via ORAL
  Filled 2013-02-20: qty 2

## 2013-02-20 MED ORDER — CIPROFLOXACIN IN D5W 400 MG/200ML IV SOLN
400.0000 mg | Freq: Two times a day (BID) | INTRAVENOUS | Status: DC
  Administered 2013-02-20 – 2013-02-22 (×5): 400 mg via INTRAVENOUS
  Filled 2013-02-20 (×6): qty 200

## 2013-02-20 MED ORDER — OCTREOTIDE ACETATE 100 MCG/ML IJ SOLN
100.0000 ug | Freq: Two times a day (BID) | INTRAMUSCULAR | Status: DC
  Administered 2013-02-20 – 2013-02-24 (×10): 100 ug via SUBCUTANEOUS
  Filled 2013-02-20 (×16): qty 1

## 2013-02-20 MED ORDER — MIDODRINE HCL 5 MG PO TABS
10.0000 mg | ORAL_TABLET | Freq: Three times a day (TID) | ORAL | Status: DC
  Administered 2013-02-20 – 2013-02-24 (×13): 10 mg via ORAL
  Filled 2013-02-20 (×18): qty 2

## 2013-02-20 NOTE — Consult Note (Signed)
Encounter reviewed agree with assessment and plan.   Deandrea Rion L. Ladona Ridgel, MD MBA The Palliative Medicine Team at [facility] Phone: [phone] Pager: [phone]

## 2013-02-20 NOTE — Progress Notes (Signed)
Patient ID: Teresa House, female   DOB: 09/28/1965, 47 y.o.   MRN: [id number] S:has HA and feels swollen O:BP 117/77  Pulse 74  Temp(Src) 97.2 F (36.2 C) (Oral)  Resp 10  Ht 5\' 6"  (1.676 m)  Wt 65.3 kg (143 lb 15.4 oz)  BMI 23.25 kg/m2  SpO2 94%  Intake/Output Summary (Last 24 hours) at 02/20/13 1145 Last data filed at 02/20/13 1000  Gross per 24 hour  Intake 2787.58 ml  Output    695 ml  Net 2092.58 ml   Intake/Output: I/O last 3 completed shifts: In: 5225.7 [P.O.:190; I.V.:4335.7; IV Piggyback:700] Out: 1345 [Urine:1345]  Intake/Output this shift:  Total I/O In: 345.9 [I.V.:345.9] Out: -  Weight change:  WUJ:WJXBJYNWG WF in mild distress CVS: no rub Resp:decreased BS at bases NFA:OZHYQMVHQ, + ascites Ext:2+ edema   Recent Labs Lab 02/15/13 0625 02/16/13 1005 02/17/13 0425 02/18/13 0530 02/19/13 0500 02/20/13 0420  NA 118* 115* 112* 118* 120* 118*  K 4.2 4.8 4.5 3.6 3.0* 2.9*  CL 88* 85* 83* 87* 88* 85*  CO2 16* 14* 12* 15* 16* 19  GLUCOSE 97 112* 153* 145* 159* 157*  BUN 40* 54* 63* 57* 57* 60*  CREATININE 1.54* 2.23* 2.35* 2.01* 1.73* 1.70*  ALBUMIN 1.6* 1.5* 2.1* 2.8* 2.5*  --   CALCIUM 7.6* 7.4* 7.2* 7.0* 7.0* 7.2*  PHOS  --   --  5.6*  --   --  4.0  AST 202* 210* 181* 204* 287*  --   ALT 106* 116* 112* 103* 153*  --    Liver Function Tests:  Recent Labs Lab 02/17/13 0425 02/18/13 0530 02/19/13 0500  AST 181* 204* 287*  ALT 112* 103* 153*  ALKPHOS 149* 90 100  BILITOT 28.9* 27.3* 29.3*  PROT 4.7* 4.5* 4.2*  ALBUMIN 2.1* 2.8* 2.5*   No results found for this basename: LIPASE, AMYLASE,  in the last 168 hours  Recent Labs Lab 02/16/13 1816  AMMONIA 27   CBC:  Recent Labs Lab 02/17/13 0545  WBC 37.2*  HGB 12.7  HCT 34.3*  MCV 104.6*  PLT 156   Cardiac Enzymes: No results found for this basename: CKTOTAL, CKMB, CKMBINDEX, TROPONINI,  in the last 168 hours CBG:  Recent Labs Lab 02/19/13 1612  GLUCAP 147*    Iron  Studies: No results found for this basename: IRON, TIBC, TRANSFERRIN, FERRITIN,  in the last 72 hours Studies/Results: No results found. Marland Kitchen antiseptic oral rinse  15 mL Mouth Rinse BID  . ciprofloxacin  400 mg Intravenous Q12H  . levothyroxine  50 mcg Oral QAC breakfast  . metronidazole  500 mg Intravenous Q12H  . midodrine  10 mg Oral TID WC  . nicotine  21 mg Transdermal Q24H  . sodium chloride  3 mL Intravenous Q12H    BMET    Component Value Date/Time   NA 118* 02/20/2013 0420   K 2.9* 02/20/2013 0420   CL 85* 02/20/2013 0420   CO2 19 02/20/2013 0420   GLUCOSE 157* 02/20/2013 0420   BUN 60* 02/20/2013 0420   CREATININE 1.70* 02/20/2013 0420   CALCIUM 7.2* 02/20/2013 0420   GFRNONAA 35* 02/20/2013 0420   GFRAA 41* 02/20/2013 0420   CBC    Component Value Date/Time   WBC 37.2* 02/17/2013 0545   RBC 3.28* 02/17/2013 0545   RBC 4.04 07/15/2008 1248   HGB 12.7 02/17/2013 0545   HCT 34.3* 02/17/2013 0545   PLT 156 02/17/2013 0545   MCV 104.6* 02/17/2013 0545  MCH 38.7* 02/17/2013 0545   MCHC 37.0* 02/17/2013 0545   RDW 15.1 02/17/2013 0545   LYMPHSABS 0.8 02/27/2013 1851   MONOABS 1.1* 02/22/2013 1851   EOSABS 0.1 02/10/2013 1851   BASOSABS 0.0 02/08/2013 1851     Assessment/Plan:  1. AKI: possible hepatorenal syndrome vs ischemic ATN in setting of large volume paracentesis and 3rd spacing with decreased renal perfusion. Agree with treatment for HRS for now.  1. Low CVP and receiving bolus of NS which was changed to isotonic bicarb given the development of metabolic acidosis and improvement of Scr.  2. Stop vasopressin and levophed as tolerated 3. ascites is worse after IVF but Scr has improved.  4. Will stop IVF's and switch over to octreotide/midodrine and consider aldactone given anasarca and hypokalemia 2. Hyponatremia- secondary to cirrhosis. Improved with IVF's but now dropping with worsening ascites/anasarca.  Stop IVF's 3. Metabolic acidosis- responding to bicarb  drip (will administer slowly given ascites) and may require HD if this worsens and if she and family want more aggressive measures.  1. Will give another 500cc today and follow 4. Painless jaundice- will need to r/o malignancy  5. Decompensated Cirrhosis- secondary to Etoh and Hep C. 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 She is not a liver transplant candidate given her recent Etoh use and agree with palliative care consult to help set goals/limits of care.  6. Dispo- agree with Palliative care meeting. 7.   [name]

## 2013-02-20 NOTE — Progress Notes (Signed)
CRITICAL VALUE ALERT  Critical value received: sodium 118  Date of notification:  02/20/2013  Time of notification: 0526  Critical value read back:yes  Nurse who received alert:  Jacqulynn Cadet   MD notified (1st page):  Expected value  Time of first page: expected value  MD notified (2nd page):  Time of second page:  Responding MD:  expected value   Time MD responded: expected value

## 2013-02-20 NOTE — Progress Notes (Signed)
UR completed.  Derrick Orris, RN BSN [facility] CCM Trauma/Neuro ICU Case Manager [phone]  

## 2013-02-20 NOTE — Progress Notes (Signed)
Subjective: No acute events.  Can't sleep.  Objective: Vital signs in last 24 hours: Temp:  [97.3 F (36.3 C)-98.4 F (36.9 C)] 97.5 F (36.4 C) (12/18 0419) Pulse Rate:  [69-98] 75 (12/18 0600) Resp:  [9-19] 10 (12/18 0600) BP: (88-136)/(60-90) 108/73 mmHg (12/18 0600) SpO2:  [86 %-100 %] 96 % (12/18 0600) Last BM Date: 02/19/13  Intake/Output from previous day: 12/17 0701 - 12/18 0700 In: 2932.4 [P.O.:70; I.V.:2462.4; IV Piggyback:400] Out: 870 [Urine:870] Intake/Output this shift:    General appearance: alert and no distress GI: distended with ascites, nontender, moderately tense, but no change from prior exams  Lab Results: No results found for this basename: WBC, HGB, HCT, PLT,  in the last 72 hours BMET  Recent Labs  02/18/13 0530 02/19/13 0500 02/20/13 0420  NA 118* 120* 118*  K 3.6 3.0* 2.9*  CL 87* 88* 85*  CO2 15* 16* 19  GLUCOSE 145* 159* 157*  BUN 57* 57* 60*  CREATININE 2.01* 1.73* 1.70*  CALCIUM 7.0* 7.0* 7.2*   LFT  Recent Labs  02/19/13 0500  PROT 4.2*  ALBUMIN 2.5*  AST 287*  ALT 153*  ALKPHOS 100  BILITOT 29.3*   PT/INR No results found for this basename: LABPROT, INR,  in the last 72 hours Hepatitis Panel No results found for this basename: HEPBSAG, HCVAB, HEPAIGM, HEPBIGM,  in the last 72 hours C-Diff No results found for this basename: CDIFFTOX,  in the last 72 hours Fecal Lactopherrin No results found for this basename: FECLLACTOFRN,  in the last 72 hours  Studies/Results: No results found.  Medications:  Scheduled: . antiseptic oral rinse  15 mL Mouth Rinse BID  . ciprofloxacin  400 mg Intravenous Q24H  . levothyroxine  50 mcg Oral QAC breakfast  . metronidazole  500 mg Intravenous Q12H  . midodrine  5 mg Oral TID WC  . nicotine  21 mg Transdermal Q24H  . sodium chloride  3 mL Intravenous Q12H   Continuous: . norepinephrine (LEVOPHED) Adult infusion 15 mcg/min (02/20/13 0620)  .  sodium bicarbonate 150 mEq in  sterile water 1000 mL infusion 50 mL/hr at 02/20/13 0600  . vasopressin (PITRESSIN) infusion - *FOR SHOCK* 0.03 Units/min (02/20/13 0600)    Assessment/Plan: 1) Decompensated cirrhosis. 2) Renal insufficiency - HRS versus ischemic ATN. 3) ETOH abuse. 4) Ascites. 5) Hyponatremia.   She remains stable.  Mild improvement in creatinine, but there is an overall mild decrease in her urine output. Sodium at 118.  There is no role for a TIPS at this time as her ascites is not considered refractory.  I would hold on any intervention.  If her creatinine continues to improve, then a paracentesis can be pursued.  Overall this is a very difficult clinical management situation.  Plan: 1) Continue with her current care.     LOS: 9 days   Teresa House D 02/20/2013, 7:18 AM

## 2013-02-20 NOTE — Progress Notes (Signed)
Progress Note from the Palliative Medicine Team at [facility]  Subjective:  patietn is awake and oriented, SO Mike at bedside  -continued conversation regarding importance  patient participation  in health and wellness   Objective: Allergies  Allergen Reactions  . Penicillins Other (See Comments)    Childhood reaction    Scheduled Meds: . antiseptic oral rinse  15 mL Mouth Rinse BID  . ciprofloxacin  400 mg Intravenous Q12H  . levothyroxine  50 mcg Oral QAC breakfast  . metronidazole  500 mg Intravenous Q12H  . midodrine  10 mg Oral TID WC  . nicotine  21 mg Transdermal Q24H  . octreotide  100 mcg Subcutaneous BID  . sodium chloride  3 mL Intravenous Q12H   Continuous Infusions: . norepinephrine (LEVOPHED) Adult infusion 10 mcg/min (02/20/13 1224)  .  sodium bicarbonate 150 mEq in sterile water 1000 mL infusion 50 mL/hr at 02/20/13 1000   PRN Meds:.LORazepam, morphine injection, ondansetron (ZOFRAN) IV, ondansetron  BP 93/65  Pulse 84  Temp(Src) 97.2 F (36.2 C) (Oral)  Resp 13  Ht 5\' 6"  (1.676 m)  Wt 65.3 kg (143 lb 15.4 oz)  BMI 23.25 kg/m2  SpO2 97%   PPS:30 %  Pain Score:  C/o headache and generally uncomfortable   Intake/Output Summary (Last 24 hours) at 02/20/13 1232 Last data filed at 02/20/13 1000  Gross per 24 hour  Intake 2461.08 ml  Output    595 ml  Net 1866.08 ml        Physical Exam:  General: ill appearing, NAD, thin and frail  HEENT: Mm, no exudate, + temporal muscle wasting  Chest: Decreased in bases CTA  CVS: RRR  Abdomen: distended firm,tender, distant BS  Ext: BLE trace edema  Neuro: alert and oriented X3   Labs: CBC    Component Value Date/Time   WBC 37.2* 02/17/2013 0545   RBC 3.28* 02/17/2013 0545   RBC 4.04 07/15/2008 1248   HGB 12.7 02/17/2013 0545   HCT 34.3* 02/17/2013 0545   PLT 156 02/17/2013 0545   MCV 104.6* 02/17/2013 0545   MCH 38.7* 02/17/2013 0545   MCHC 37.0* 02/17/2013 0545   RDW 15.1 02/17/2013 0545   LYMPHSABS 0.8 Mar 07, 2013 1851   MONOABS 1.1* 03-07-13 1851   EOSABS 0.1 07-Mar-2013 1851   BASOSABS 0.0 Mar 07, 2013 1851    BMET    Component Value Date/Time   NA 118* 02/20/2013 0420   K 2.9* 02/20/2013 0420   CL 85* 02/20/2013 0420   CO2 19 02/20/2013 0420   GLUCOSE 157* 02/20/2013 0420   BUN 60* 02/20/2013 0420   CREATININE 1.70* 02/20/2013 0420   CALCIUM 7.2* 02/20/2013 0420   GFRNONAA 35* 02/20/2013 0420   GFRAA 41* 02/20/2013 0420    CMP     Component Value Date/Time   NA 118* 02/20/2013 0420   K 2.9* 02/20/2013 0420   CL 85* 02/20/2013 0420   CO2 19 02/20/2013 0420   GLUCOSE 157* 02/20/2013 0420   BUN 60* 02/20/2013 0420   CREATININE 1.70* 02/20/2013 0420   CALCIUM 7.2* 02/20/2013 0420   PROT 4.2* 02/19/2013 0500   ALBUMIN 2.5* 02/19/2013 0500   AST 287* 02/19/2013 0500   ALT 153* 02/19/2013 0500   ALKPHOS 100 02/19/2013 0500   BILITOT 29.3* 02/19/2013 0500   GFRNONAA 35* 02/20/2013 0420   GFRAA 41* 02/20/2013 0420     Assessment and Plan:  1. Code Status: Full code   2. Scope of Treatment:  Continue with all  available and offered medical interventions to prolong life. Patient is hopeful to return to baseline.  Patient tells me she is hopeful for liver transplant in the future if she can remain clean and sober for one year.  We discussed consideration of 12 step program support.  4. Disposition: Hopeful to return home when medically stable. We discussed consideration of  SNF for rehabilitation and strength building when stable for dc   3. Symptom Management:  1. Weakness/Fatigue: Continue medical management                                        Participation in PT/OT                                       Incorporate self motivated exercise   4. Psychosocial: Emotional support offered to patient and her SO. They both verbalized an understanding of the seriousness of situation.   5. Spiritual: Decline chaplain support at this time     Time In Time  Out Total Time Spent with Patient Total Overall Time  1325 1400 35 min 35 min    Greater than 50%  of this time was spent counseling and coordinating care related to the above assessment and plan.  Lorinda Creed NP  Palliative Medicine Team Team Phone # [phone] Pager [phone]   1

## 2013-02-20 NOTE — Progress Notes (Signed)
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  She developed renal failure with concern for hepatorenal syndrome.  She was transferred to ICU for levophed therapy, and PCCM assumed care.  SIGNIFICANT EVENTS: 12/10 Paracentesis >> 2.4 liters, GI consulted 12/14 Renal consulted, transfer to ICU for levophed therapy  STUDIES:  12/09 Abd u/s >> cirrhosis, ascites, normal kidneys, gallbladder sludge 12/09 Heptobiliary scan >> Indeterminate hepatobiliary scan with no visualization of the biliary tree secondary to severe hepatocellular disease. Acute hep panel 12/09 >> HCV Ab reactive HIV 12/10 >> non reactive 12/10 Urine Na < 10, Urine osmo 257 Hep C viral load 12/11 >> negative  LINES / TUBES: CVL 12/14 >>   CULTURES: U/A 12/09 >> trichomonas Urine 12/09 >> E coli  ANTIBIOTICS: Rocephin 12/09 >> 12/09 Flagyl 12/09 >>  Cipro 12/09 >>    SUBJECTIVE:  Denies CP, dyspnea Afebrile, mild dyspnea decreased UO C/o anxiety Loose stools, incontinent   VITAL SIGNS: Temp:  [97.2 F (36.2 C)-97.6 F (36.4 C)] 97.2 F (36.2 C) (12/18 0824) Pulse Rate:  [71-98] 74 (12/18 1000) Resp:  [9-19] 10 (12/18 1000) BP: (88-136)/(62-90) 117/77 mmHg (12/18 1000) SpO2:  [86 %-100 %] 94 % (12/18 1000)   INTAKE / OUTPUT: Intake/Output     12/17 0701 - 12/18 0700 12/18 0701 - 12/19 0700   P.O. 70    I.V. (mL/kg) 2577.7 (39.5) 345.9 (5.3)   IV Piggyback 400    Total Intake(mL/kg) 3047.7 (46.7) 345.9 (5.3)   Urine (mL/kg/hr) 870 (0.6)    Total Output 870     Net +2177.7 +345.9        Stool Occurrence 3 x      PHYSICAL EXAMINATION: General: no distress Neuro:  Alert, normal  strength, follows commands, no asterexis HEENT:  Jaundice, no sinus tenderness Cardiovascular:  Regular, no murmur Lungs:  Decreased BS at bases, no wheeze Abdomen:  Distended, + fluid wave, non tender Musculoskeletal:  1+ leg edema Skin:  jaundice  LABS:  CBC  Recent Labs Lab 02/17/13 0545  WBC 37.2*  HGB 12.7  HCT 34.3*  PLT 156   Coag's No results found for this basename: APTT, INR,  in the last 168 hours BMET  Recent Labs Lab 02/18/13 0530 02/19/13 0500 02/20/13 0420  NA 118* 120* 118*  K 3.6 3.0* 2.9*  CL 87* 88* 85*  CO2 15* 16* 19  BUN 57* 57* 60*  CREATININE 2.01* 1.73* 1.70*  GLUCOSE 145* 159* 157*   Electrolytes  Recent Labs Lab 02/17/13 0425 02/18/13 0530 02/19/13 0500 02/20/13 0420  CALCIUM 7.2* 7.0* 7.0* 7.2*  MG 2.4  --   --  2.2  PHOS 5.6*  --   --  4.0   Sepsis Markers No results found for this basename: LATICACIDVEN, PROCALCITON, O2SATVEN,  in the last 168 hours ABG No results found for this basename: PHART, PCO2ART, PO2ART,  in the last 168 hours Liver Enzymes  Recent Labs Lab 02/17/13 0425 02/18/13 0530 02/19/13 0500  AST 181* 204* 287*  ALT 112* 103* 153*  ALKPHOS 149* 90 100  BILITOT 28.9* 27.3* 29.3*  ALBUMIN 2.1* 2.8* 2.5*   Cardiac Enzymes No results found for this basename: TROPONINI, PROBNP,  in the last 168 hours Glucose  Recent Labs Lab 02/19/13 1612  GLUCAP 147*    Imaging No results found.   ASSESSMENT / PLAN:  PULMONARY A: Passive atelectasis. Tobacco abuse. P:   -flutter valve -monitor oxygenation -smoking cessation; continue nicoderm  CARDIOVASCULAR A: Shock ? septic P:  -levophed gtt  Can aim for MAP 65 now -dc vaso gtt   RENAL A:   Probable heptorenal syndrome. Hyponatremia. P:   -dc albumin -f/u renal fx, urine outpt, electrolytes -Increase midodrine 10 tid  GASTROINTESTINAL A:   ETOH cirrhosis with jaundice. P:   -f/u LFT's intermittent -protonix for SUP -GI following,  agree with defer paracentesis -Is she a TIPS candidate? - lactulose -restart only if encephalopathy  HEMATOLOGIC A:   Leucocytosis ? steroids P:  -f/u CBC, coag's -SCD for DVT prevention  INFECTIOUS A:   E coli UTI. Trichomonas. P:   -continue cipro for SBp proph after 12/19  -dc flagyl 12/19  ENDOCRINE A:  Cortisol ok, TSH high ? hypothyroid  P:  Ct low dose levothyroxine  NEUROLOGIC A:   ETOH abuse >> last drink reported to be 10 days prior to admission. 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  I have reviewed this patient's available data, including medical history, events of note, physical examination and test results as part of my evaluation  CC time x 31 m  Cyril Mourning MD. Tonny Bollman. [facility] Pager [phone] If no response call [phone]    02/20/2013, 11:03 AM

## 2013-02-21 LAB — COMPREHENSIVE METABOLIC PANEL
ALT: 118 U/L — ABNORMAL HIGH (ref 0–35)
AST: 179 U/L — ABNORMAL HIGH (ref 0–37)
Albumin: 2.1 g/dL — ABNORMAL LOW (ref 3.5–5.2)
Alkaline Phosphatase: 102 U/L (ref 39–117)
Calcium: 6.6 mg/dL — ABNORMAL LOW (ref 8.4–10.5)
Chloride: 86 mEq/L — ABNORMAL LOW (ref 96–112)
GFR calc Af Amer: 37 mL/min — ABNORMAL LOW (ref >90)
Potassium: 3.5 mEq/L (ref 3.5–5.1)
Sodium: 119 mEq/L — CL (ref 135–145)
Total Protein: 3.9 g/dL — ABNORMAL LOW (ref 6.0–8.3)

## 2013-02-21 MED ORDER — ALBUMIN HUMAN 5 % IV SOLN
12.5000 g | Freq: Once | INTRAVENOUS | Status: AC
  Administered 2013-02-21: 12.5 g via INTRAVENOUS
  Filled 2013-02-21: qty 250

## 2013-02-21 MED ORDER — FUROSEMIDE 10 MG/ML IJ SOLN: 80.0000 mg | Freq: Once | INTRAMUSCULAR | Status: DC

## 2013-02-21 MED ORDER — ALBUMIN HUMAN 25 % IV SOLN
12.5000 g | Freq: Once | INTRAVENOUS | Status: DC
  Filled 2013-02-21: qty 50

## 2013-02-21 MED ORDER — FUROSEMIDE 10 MG/ML IJ SOLN
80.0000 mg | Freq: Once | INTRAMUSCULAR | Status: AC
  Administered 2013-02-21: 80 mg via INTRAVENOUS
  Filled 2013-02-21: qty 8

## 2013-02-21 MED ORDER — ALBUMIN HUMAN 5 % IV SOLN
12.5000 g | Freq: Four times a day (QID) | INTRAVENOUS | Status: AC
  Administered 2013-02-21 (×3): 12.5 g via INTRAVENOUS
  Filled 2013-02-21 (×3): qty 250

## 2013-02-21 NOTE — Progress Notes (Signed)
Subjective: No complaints at this time.  Objective: Vital signs in last 24 hours: Temp:  [96.4 F (35.8 C)-98.2 F (36.8 C)] 97.6 F (36.4 C) (12/19 0344) Pulse Rate:  [43-119] 103 (12/19 0700) Resp:  [8-20] 13 (12/19 0700) BP: (84-117)/(55-80) 99/74 mmHg (12/19 0700) SpO2:  [45 %-100 %] 85 % (12/19 0700) Weight:  [162 lb 0.6 oz (73.5 kg)] 162 lb 0.6 oz (73.5 kg) (12/19 0615) Last BM Date: 02/19/13  Intake/Output from previous day: 12/18 0701 - 12/19 0700 In: 2021.7 [I.V.:1721.7; IV Piggyback:300] Out: 340 [Urine:340] Intake/Output this shift:    General appearance: alert and no distress GI: distended with ascites.  firmer.  Lab Results: No results found for this basename: WBC, HGB, HCT, PLT,  in the last 72 hours BMET  Recent Labs  02/19/13 0500 02/20/13 0420 02/21/13 0445  NA 120* 118* 119*  K 3.0* 2.9* 3.5  CL 88* 85* 86*  CO2 16* 19 19  GLUCOSE 159* 157* 124*  BUN 57* 60* 64*  CREATININE 1.73* 1.70* 1.84*  CALCIUM 7.0* 7.2* 6.6*   LFT  Recent Labs  02/21/13 0445  PROT 3.9*  ALBUMIN 2.1*  AST 179*  ALT 118*  ALKPHOS 102  BILITOT 23.6*   PT/INR No results found for this basename: LABPROT, INR,  in the last 72 hours Hepatitis Panel No results found for this basename: HEPBSAG, HCVAB, HEPAIGM, HEPBIGM,  in the last 72 hours C-Diff No results found for this basename: CDIFFTOX,  in the last 72 hours Fecal Lactopherrin No results found for this basename: FECLLACTOFRN,  in the last 72 hours  Studies/Results: No results found.  Medications:  Scheduled: . antiseptic oral rinse  15 mL Mouth Rinse BID  . ciprofloxacin  400 mg Intravenous Q12H  . levothyroxine  50 mcg Oral QAC breakfast  . metronidazole  500 mg Intravenous Q12H  . midodrine  10 mg Oral TID WC  . nicotine  21 mg Transdermal Q24H  . octreotide  100 mcg Subcutaneous BID  . sodium chloride  3 mL Intravenous Q12H   Continuous: . norepinephrine (LEVOPHED) Adult infusion 10 mcg/min  (02/21/13 0700)  .  sodium bicarbonate 150 mEq in sterile water 1000 mL infusion 50 mL/hr at 02/21/13 0700    Assessment/Plan: 1) Decompensated cirrhosis. 2) Renal failure. 3) Ascites - becoming tense. 4) ETOH abuse.   Her urine output has dropped.  I had a frank conversation with the patient and I told her that she has a very poor prognosis.  There is no significant gain with the current therapies.  I am told that Dr. Vassie Loll is going to have a discussion with the patient and her husband.    Plan: 1) I recommend comfort care. 2) Signing off.   LOS: 10 days   Purvis Sidle D 02/21/2013, 7:40 AM

## 2013-02-21 NOTE — Progress Notes (Signed)
Patient ID: Teresa House, female   DOB: [birth year], 47 y.o.   MRN: [id number] S:felt very weak and unable to stand up today O:BP 98/68  Pulse 85  Temp(Src) 97.7 F (36.5 C) (Oral)  Resp 13  Ht 5\' 6"  (1.676 m)  Wt 73.5 kg (162 lb 0.6 oz)  BMI 26.17 kg/m2  SpO2 96%  Intake/Output Summary (Last 24 hours) at 02/21/13 1428 Last data filed at 02/21/13 1300  Gross per 24 hour  Intake 2531.51 ml  Output    247 ml  Net 2284.51 ml   Intake/Output: I/O last 3 completed shifts: In: 3441.4 [I.V.:3041.4; IV Piggyback:400] Out: 675 [Urine:675]  Intake/Output this shift:  Total I/O In: 1544 [I.V.:894; IV Piggyback:650] Out: 32 [Urine:32] Weight change:  Gen: jaundiced, frail and chronically ill CVS: faint HS, no rub Resp: decreased BS at bases Abd: distended, +fluid wave Ext: +anasarca   Recent Labs Lab 02/15/13 0625 02/16/13 1005 02/17/13 0425 02/18/13 0530 02/19/13 0500 02/20/13 0420 02/21/13 0445  NA 118* 115* 112* 118* 120* 118* 119*  K 4.2 4.8 4.5 3.6 3.0* 2.9* 3.5  CL 88* 85* 83* 87* 88* 85* 86*  CO2 16* 14* 12* 15* 16* 19 19  GLUCOSE 97 112* 153* 145* 159* 157* 124*  BUN 40* 54* 63* 57* 57* 60* 64*  CREATININE 1.54* 2.23* 2.35* 2.01* 1.73* 1.70* 1.84*  ALBUMIN 1.6* 1.5* 2.1* 2.8* 2.5*  --  2.1*  CALCIUM 7.6* 7.4* 7.2* 7.0* 7.0* 7.2* 6.6*  PHOS  --   --  5.6*  --   --  4.0  --   AST 202* 210* 181* 204* 287*  --  179*  ALT 106* 116* 112* 103* 153*  --  118*   Liver Function Tests:  Recent Labs Lab 02/18/13 0530 02/19/13 0500 02/21/13 0445  AST 204* 287* 179*  ALT 103* 153* 118*  ALKPHOS 90 100 102  BILITOT 27.3* 29.3* 23.6*  PROT 4.5* 4.2* 3.9*  ALBUMIN 2.8* 2.5* 2.1*   No results found for this basename: LIPASE, AMYLASE,  in the last 168 hours  Recent Labs Lab 02/16/13 1816  AMMONIA 27   CBC:  Recent Labs Lab 02/17/13 0545  WBC 37.2*  HGB 12.7  HCT 34.3*  MCV 104.6*  PLT 156   Cardiac Enzymes: No results found for this basename:  CKTOTAL, CKMB, CKMBINDEX, TROPONINI,  in the last 168 hours CBG:  Recent Labs Lab 02/19/13 1612  GLUCAP 147*    Iron Studies: No results found for this basename: IRON, TIBC, TRANSFERRIN, FERRITIN,  in the last 72 hours Studies/Results: No results found. Marland Kitchen albumin human  12.5 g Intravenous Q6H  . antiseptic oral rinse  15 mL Mouth Rinse BID  . ciprofloxacin  400 mg Intravenous Q12H  . levothyroxine  50 mcg Oral QAC breakfast  . midodrine  10 mg Oral TID WC  . nicotine  21 mg Transdermal Q24H  . octreotide  100 mcg Subcutaneous BID  . sodium chloride  3 mL Intravenous Q12H    BMET    Component Value Date/Time   NA 119* 02/21/2013 0445   K 3.5 02/21/2013 0445   CL 86* 02/21/2013 0445   CO2 19 02/21/2013 0445   GLUCOSE 124* 02/21/2013 0445   BUN 64* 02/21/2013 0445   CREATININE 1.84* 02/21/2013 0445   CALCIUM 6.6* 02/21/2013 0445   GFRNONAA 32* 02/21/2013 0445   GFRAA 37* 02/21/2013 0445   CBC    Component Value Date/Time   WBC 37.2* 02/17/2013 0545  RBC 3.28* 02/17/2013 0545   RBC 4.04 07/15/2008 1248   HGB 12.7 02/17/2013 0545   HCT 34.3* 02/17/2013 0545   PLT 156 02/17/2013 0545   MCV 104.6* 02/17/2013 0545   MCH 38.7* 02/17/2013 0545   MCHC 37.0* 02/17/2013 0545   RDW 15.1 02/17/2013 0545   LYMPHSABS 0.8 Mar 03, 2013 1851   MONOABS 1.1* March 03, 2013 1851   EOSABS 0.1 March 03, 2013 1851   BASOSABS 0.0 2013-03-03 1851     Assessment/Plan:  1. AKI: hepatorenal syndrome vs ischemic ATN in setting of large volume paracentesis and 3rd spacing with decreased renal perfusion. Agree with treatment for HRS unfortunately she is now oliguric.  Overall prognosis remains poor however pt is not able to accept the severity of her illness and cont to wish for aggressive measures. 1. Stopped vasopressin and attempting to wean levophed as tolerated 2. ascites is worse after IVF but Scr has improved initially, now increased Scr off of IVF's. 3. Octreotide/midodrine and consider aldactone  given anasarca and hypokalemia 4. Will give IV lasix and see if we can help with volume however I would not offer CVVHD since she is not a transplant candidate however pt wants to "try everything possible"   Will discuss with PCCM 2. Hyponatremia- secondary to cirrhosis. Improved with IVF's but now dropping with worsening ascites/anasarca. Start IV lasix and follow 3. Metabolic acidosis- responding to bicarb drip (will administer slowly given ascites) and may require HD if this worsens and if she and family want more aggressive measures.  1. Improved after another 500cc yesterday 4. Decompensated Cirrhosis- secondary to Etoh and Hep C. 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 She is not a liver transplant candidate given her recent Etoh use and agree with palliative care consult to help set goals/limits of care.  5. Dispo- agree with GI. Prognosis grim. 6.   Jkayla Spiewak A

## 2013-02-21 NOTE — Progress Notes (Signed)
I have reviewed and discussed the care of this patient in detail with the nurse practitioner including pertinent patient records, physical exam findings and data. I agree with details of this encounter.  

## 2013-02-21 NOTE — Progress Notes (Signed)
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  She developed renal failure with concern for hepatorenal syndrome.  She was transferred to ICU for levophed therapy, and PCCM assumed care.  SIGNIFICANT EVENTS: 12/10 Paracentesis >> 2.4 liters, GI consulted 12/14 Renal consulted, transfer to ICU for levophed therapy  STUDIES:  12/09 Abd u/s >> cirrhosis, ascites, normal kidneys, gallbladder sludge 12/09 Heptobiliary scan >> Indeterminate hepatobiliary scan with no visualization of the biliary tree secondary to severe hepatocellular disease. Acute hep panel 12/09 >> HCV Ab reactive HIV 12/10 >> non reactive 12/10 Urine Na < 10, Urine osmo 257 Hep C viral load 12/11 >> negative  LINES / TUBES: CVL 12/14 >>   CULTURES: U/A 12/09 >> trichomonas Urine 12/09 >> E coli  ANTIBIOTICS: Rocephin 12/09 >> 12/09 Flagyl 12/09 >> 12/19 Cipro 12/09 >>    SUBJECTIVE:  C/o  dyspnea Afebrile, mild dyspnea decreased UO -mildly better with al;bumin bolus C/o anxiety Loose stools, incontinent   VITAL SIGNS: Temp:  [96.4 F (35.8 C)-98.2 F (36.8 C)] 97.4 F (36.3 C) (12/19 0700) Pulse Rate:  [43-119] 103 (12/19 0700) Resp:  [8-20] 13 (12/19 0700) BP: (84-117)/(55-80) 99/74 mmHg (12/19 0700) SpO2:  [45 %-100 %] 85 % (12/19 0700) Weight:  [73.5 kg (162 lb 0.6 oz)] 73.5 kg (162 lb 0.6 oz) (12/19 0615)   INTAKE / OUTPUT: Intake/Output     12/18 0701 - 12/19 0700 12/19 0701 - 12/20 0700   P.O.     I.V. (mL/kg) 1721.7 (23.4)    IV Piggyback 300    Total Intake(mL/kg) 2021.7 (27.5)    Urine (mL/kg/hr) 340 (0.2)    Total Output 340     Net +1681.7          Stool Occurrence 4  x      PHYSICAL EXAMINATION: General: no distress Neuro:  Alert, normal strength, follows commands, no asterexis HEENT:  Jaundice, no sinus tenderness Cardiovascular:  Regular, no murmur Lungs:  Decreased BS at bases, no wheeze Abdomen:  Distended, + fluid wave, non tender Musculoskeletal:  1+ leg edema Skin:  jaundice  LABS:  CBC  Recent Labs Lab 02/17/13 0545  WBC 37.2*  HGB 12.7  HCT 34.3*  PLT 156   Coag's No results found for this basename: APTT, INR,  in the last 168 hours BMET  Recent Labs Lab 02/19/13 0500 02/20/13 0420 02/21/13 0445  NA 120* 118* 119*  K 3.0* 2.9* 3.5  CL 88* 85* 86*  CO2 16* 19 19  BUN 57* 60* 64*  CREATININE 1.73* 1.70* 1.84*  GLUCOSE 159* 157* 124*   Electrolytes  Recent Labs Lab 02/17/13 0425  02/19/13 0500 02/20/13 0420 02/21/13 0445  CALCIUM 7.2*  < > 7.0* 7.2* 6.6*  MG 2.4  --   --  2.2  --   PHOS 5.6*  --   --  4.0  --   < > = values in this interval not displayed. Sepsis Markers No results found for this basename: LATICACIDVEN, PROCALCITON, O2SATVEN,  in the last 168 hours ABG No results found for this basename: PHART, PCO2ART, PO2ART,  in the last 168 hours Liver Enzymes  Recent Labs Lab 02/18/13 0530 02/19/13 0500 02/21/13 0445  AST 204* 287* 179*  ALT 103* 153* 118*  ALKPHOS 90 100 102  BILITOT 27.3* 29.3* 23.6*  ALBUMIN 2.8* 2.5* 2.1*   Cardiac Enzymes No results found for this basename: TROPONINI, PROBNP,  in the last 168 hours Glucose  Recent Labs Lab 02/19/13 1612  GLUCAP 147*    Imaging No results found.   ASSESSMENT / PLAN:  PULMONARY A: Passive atelectasis. Tobacco abuse. Orthodeoxia - ? related to porto -pulm htn P:   -flutter valve -smoking cessation; continue nicoderm  CARDIOVASCULAR A: Shock ? septic P:  -levophed gtt  Can aim for MAP 65 now -Off vaso gtt   RENAL A:   Probable heptorenal syndrome/ pre-renal -cr plateauing. Hyponatremia. P:   -dc albumin -f/u  renal fx, urine outpt, electrolytes -Increase midodrine 10 tid -ct octreotide  GASTROINTESTINAL A:   ETOH cirrhosis with jaundice. P:   - LFT's improving -protonix for SUP -GI following -defer paracentesis for fear of worsening renal fn, perform only if breathing worse -Is she a TIPS candidate? - lactulose -restart only if encephalopathy  HEMATOLOGIC A:   Leucocytosis ? steroids P:  -f/u CBC, coag's -SCD for DVT prevention  INFECTIOUS A:   E coli UTI. Trichomonas. P:   -continue cipro for SBp proph after 12/19  -dc flagyl 12/19  ENDOCRINE A:  Cortisol ok, TSH high ? hypothyroid  P:  Ct low dose levothyroxine  NEUROLOGIC A:   ETOH abuse >> last drink reported to be 10 days prior to admission. 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  I have reviewed this patient's available data, including medical history, events of note, physical examination and test results as part of my evaluation  CC time x 31 m  Cyril Mourning MD. Tonny Bollman. [facility] Pager [phone] If no response call [phone]    02/21/2013, 8:47 AM

## 2013-02-21 NOTE — Progress Notes (Signed)
Occupational Therapy Evaluation Patient Details Name: Leonna Schlee MRN: [id number] DOB: Sep 11, 1965 Today's Date: 02/21/2013 Time: 0865-7846 OT Time Calculation (min): 17 min  OT Assessment / Plan / Recommendation History of present illness Patient is a 47 y.o. female admitted with jaudice and worsening abdominal pain.  Found to be severely hyponatremic and with renal failure.   Clinical Impression   Limited eval due to c/o pain and pt's limiting participation. If pt D/C back to hotel, she will need the below DME. Will work with pt and team in further assessing appropriate D/C needs and POC.     OT Assessment  Patient needs continued OT Services    Follow Up Recommendations  Other (comment);Supervision/Assistance - 24 hour    Barriers to Discharge Inaccessible home environment;Decreased caregiver support lives in hotel  Equipment Recommendations  3 in 1 bedside comode;Wheelchair (measurements OT);Wheelchair cushion (measurements OT)    Recommendations for Other Services    Frequency  Min 3X/week    Precautions / Restrictions Precautions Precautions: Fall   Pertinent Vitals/Pain 8/10 pain abdomen with mobility    ADL  Grooming: Set up Where Assessed - Grooming: Supported sitting Upper Body Bathing: Set up Where Assessed - Upper Body Bathing: Supported sitting Lower Body Bathing: Maximal assistance Where Assessed - Lower Body Bathing: Rolling right and/or left Upper Body Dressing: +1 Total assistance Where Assessed - Upper Body Dressing: Rolling right and/or left Transfers/Ambulation Related to ADLs: not assessed due to pt c/o pain ADL Comments: significant decline in funciton    OT Diagnosis: Generalized weakness;Acute pain  OT Problem List: Decreased strength;Decreased range of motion;Decreased activity tolerance;Impaired balance (sitting and/or standing);Decreased safety awareness;Decreased knowledge of use of DME or AE;Decreased knowledge of  precautions;Cardiopulmonary status limiting activity;Pain;Increased edema OT Treatment Interventions: Self-care/ADL training;Therapeutic exercise;Energy conservation;DME and/or AE instruction;Therapeutic activities;Patient/family education;Balance training   OT Goals(Current goals can be found in the care plan section) Acute Rehab OT Goals Patient Stated Goal: To go home per significant other OT Goal Formulation: With patient Time For Goal Achievement: 03/07/13 Potential to Achieve Goals: Good  Visit Information  Last OT Received On: 02/21/13 Assistance Needed: +2 History of Present Illness: Patient is a 47 y.o. female admitted with jaudice and worsening abdominal pain.  Found to be severely hyponatremic and with renal failure.       Prior Functioning     Home Living Family/patient expects to be discharged to:: Other (Comment) Living Arrangements: Spouse/significant other Additional Comments: lives in hotel with partner. Has microwave and hotplate. has ramped entrance Prior Function Level of Independence: Independent Communication Communication: No difficulties Dominant Hand: Right         Vision/Perception Vision - History Baseline Vision: Wears glasses all the time   Cognition  Cognition Arousal/Alertness: Awake/alert Behavior During Therapy: WFL for tasks assessed/performed Overall Cognitive Status: Impaired/Different from baseline Area of Impairment: Problem solving Memory: Decreased short-term memory Problem Solving: Slow processing    Extremity/Trunk Assessment Upper Extremity Assessment Upper Extremity Assessment: Generalized weakness Lower Extremity Assessment Lower Extremity Assessment: Defer to PT evaluation RLE Deficits / Details: AROM WFL, strength hip flexion 3/5, knee extension 4/5 LLE Deficits / Details: AROM WFL, strength hip flexion 3/5, knee extension 4/5 Cervical / Trunk Assessment Cervical / Trunk Assessment:  (unable to tolerate trying to sit  up due to apinful abdomen)     Mobility Bed Mobility Bed Mobility: Rolling Right;Rolling Left Rolling Right: 1: +1 Total assist Rolling Left: 1: +1 Total assist Details for Bed Mobility Assistance: pt and significant other report  she just attempted to get to chair with nursing assist and had excrutiating pain in bialteral groin/abdomen and did not get very far with 3 people assisting, did not want to try again any time soon Transfers Transfers: Not assessed (due to pain)     Exercise Other Exercises Other Exercises: AROM BUE Other Exercises: AROM BLE   Balance Balance Balance Assessed: No   End of Session OT - End of Session Activity Tolerance: Patient limited by pain Patient left: in bed;with call bell/phone within reach;with family/visitor present Nurse Communication: Mobility status  GO     Stpehen Petitjean,HILLARY 02/21/2013, 4:53 PM [facility], OTR/L  [phone] 02/21/2013

## 2013-02-21 NOTE — Progress Notes (Signed)
Pt. Has low urine output since the beginnijng of the shift SBP >80's-90's with MAP >65. Dr. Glenna Fellows made aware with orders made and carried out. Will cont. to monitor pt.

## 2013-02-21 NOTE — Evaluation (Signed)
Physical Therapy Evaluation Patient Details Name: Teresa House MRN: [id number] DOB: 01/20/1966 Today's Date: 02/21/2013 Time: 1000-1015 PT Time Calculation (min): 15 min  PT Assessment / Plan / Recommendation History of Present Illness  Patient is a 47 y.o. female admitted with jaudice and worsening abdominal pain.  Found to be severely hyponatremic and with renal failure.  Clinical Impression  Patient participated in limited eval today due to pain with attempting out of bed moments before eval attempted.  Encouraged in bed mobility to improve tolerance to sitting upright.  Feel she will benefit from skilled PT in the acute setting to hopefully allow return to hotel with spouse assist as may have no resources for skilled rehab.    PT Assessment  Patient needs continued PT services    Follow Up Recommendations  Other (comment) (TBA, may be limited due to insurance)    Does the patient have the potential to tolerate intense rehabilitation    No  Barriers to Discharge Other (comment) homelessness    Equipment Recommendations  Rolling walker with 5" wheels    Recommendations for Other Services   Social Work  Frequency Min 3X/week    Precautions / Restrictions Precautions Precautions: Fall   Pertinent Vitals/Pain Minimal in bed      Mobility  Bed Mobility Bed Mobility: Not assessed Details for Bed Mobility Assistance: pt and significant other report she just attempted to get to chair with nursing assist and had excrutiating pain in bialteral groin/abdomen and did not get very far with 3 people assisting, did not want to try again any time soon    Exercises  Encouraged in bed heel slides and ankle pumps to improve tolerance for allowing upright activity   PT Diagnosis: Generalized weakness  PT Problem List: Decreased strength;Pain;Decreased balance;Decreased mobility;Decreased knowledge of precautions;Decreased activity tolerance;Decreased safety awareness;Decreased  cognition PT Treatment Interventions: DME instruction;Gait training;Functional mobility training;Patient/family education;Therapeutic activities;Therapeutic exercise     PT Goals(Current goals can be found in the care plan section) Acute Rehab PT Goals Patient Stated Goal: To go home per significant other PT Goal Formulation: With patient/family Time For Goal Achievement: 03/07/13 Potential to Achieve Goals: Fair  Visit Information  Last PT Received On: 02/21/13 Assistance Needed: +2 History of Present Illness: Patient is a 47 y.o. female admitted with jaudice and worsening abdominal pain.  Found to be severely hyponatremic and with renal failure.       Prior Functioning  Home Living Family/patient expects to be discharged to:: Other (Comment) (hotel) Living Arrangements: Spouse/significant other Prior Function Level of Independence: Independent Communication Communication: No difficulties Dominant Hand: Right    Cognition  Cognition Arousal/Alertness: Awake/alert Overall Cognitive Status: Impaired/Different from baseline Area of Impairment: Problem solving Problem Solving: Slow processing    Extremity/Trunk Assessment Upper Extremity Assessment Upper Extremity Assessment: Overall WFL for tasks assessed Lower Extremity Assessment Lower Extremity Assessment: RLE deficits/detail;LLE deficits/detail RLE Deficits / Details: AROM WFL, strength hip flexion 3/5, knee extension 4/5 LLE Deficits / Details: AROM WFL, strength hip flexion 3/5, knee extension 4/5   Balance Balance Balance Assessed: No  End of Session PT - End of Session Activity Tolerance: Patient limited by pain Patient left: in bed;with family/visitor present  GP     [facility] 02/21/2013, 1:34 PM Sheran Lawless, PT [phone] 02/21/2013

## 2013-02-22 DIAGNOSIS — R5381 Other malaise: Secondary | ICD-10-CM

## 2013-02-22 LAB — BASIC METABOLIC PANEL
BUN: 76 mg/dL — ABNORMAL HIGH (ref 6–23)
CO2: 23 mEq/L (ref 19–32)
Chloride: 82 mEq/L — ABNORMAL LOW (ref 96–112)
Creatinine, Ser: 2.25 mg/dL — ABNORMAL HIGH (ref 0.50–1.10)
GFR calc Af Amer: 29 mL/min — ABNORMAL LOW (ref >90)
Glucose, Bld: 105 mg/dL — ABNORMAL HIGH (ref 70–99)

## 2013-02-22 MED ORDER — LORAZEPAM 2 MG/ML IJ SOLN
0.2500 mg | Freq: Once | INTRAMUSCULAR | Status: AC
  Administered 2013-02-22: 18:00:00 via INTRAVENOUS
  Filled 2013-02-22: qty 1

## 2013-02-22 MED ORDER — FENTANYL 12 MCG/HR TD PT72
12.5000 ug | MEDICATED_PATCH | TRANSDERMAL | Status: DC
  Administered 2013-02-22: 12.5 ug via TRANSDERMAL
  Filled 2013-02-22: qty 1

## 2013-02-22 MED ORDER — CIPROFLOXACIN HCL 500 MG PO TABS
500.0000 mg | ORAL_TABLET | Freq: Every day | ORAL | Status: DC
  Administered 2013-02-23: 500 mg via ORAL
  Filled 2013-02-22 (×4): qty 1

## 2013-02-22 MED ORDER — LORAZEPAM 2 MG/ML IJ SOLN
0.5000 mg | INTRAMUSCULAR | Status: DC | PRN
  Administered 2013-02-23 – 2013-02-25 (×3): 0.5 mg via INTRAVENOUS
  Filled 2013-02-22 (×3): qty 1

## 2013-02-22 MED ORDER — FENTANYL CITRATE 0.05 MG/ML IJ SOLN
12.5000 ug | INTRAMUSCULAR | Status: DC | PRN
  Administered 2013-02-23: 12.5 ug via INTRAVENOUS
  Filled 2013-02-22: qty 2

## 2013-02-22 NOTE — Progress Notes (Signed)
Received pt to room 6E21 from 46M. Skin assessment complete. 3 small stage 2's found measured and documented. Cleansed area applied foam dressing and barrier cream. Educated patient on importance of turning. Verbalizes understand. Left with patient on right side. Will continue to monitor.

## 2013-02-22 NOTE — Progress Notes (Signed)
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  She developed renal failure with concern for hepatorenal syndrome.  She was transferred to ICU for levophed therapy, and PCCM assumed care.  SIGNIFICANT EVENTS: 12/10 Paracentesis >> 2.4 liters, GI consulted 12/14 Renal consulted, transfer to ICU for levophed therapy  STUDIES:  12/09 Abd u/s >> cirrhosis, ascites, normal kidneys, gallbladder sludge 12/09 Heptobiliary scan >> Indeterminate hepatobiliary scan with no visualization of the biliary tree secondary to severe hepatocellular disease. Acute hep panel 12/09 >> HCV Ab reactive HIV 12/10 >> non reactive 12/10 Urine Na < 10, Urine osmo 257 Hep C viral load 12/11 >> negative  LINES / TUBES: CVL 12/14 >>   CULTURES: U/A 12/09 >> trichomonas Urine 12/09 >> E coli  ANTIBIOTICS: Rocephin 12/09 >> 12/09 Flagyl 12/09 >> 12/19 Cipro 12/09 >>    SUBJECTIVE:  C/o  Dyspnea and mild abd fullness     VITAL SIGNS: Temp:  [97.6 F (36.4 C)-98.2 F (36.8 C)] 98.2 F (36.8 C) (12/20 1000) Pulse Rate:  [75-105] 85 (12/20 1000) Resp:  [11-20] 18 (12/20 1000) BP: (80-96)/(53-78) 96/62 mmHg (12/20 1000) SpO2:  [91 %-100 %] 94 % (12/20 1000) Weight:  [150 lb (68.04 kg)] 150 lb (68.04 kg) (12/19 2122)  02  Rx  2lpm NP   INTAKE / OUTPUT: Intake/Output     12/19 0701 - 12/20 0700 12/20 0701 - 12/21 0700   I.V. (mL/kg) 1751.5 (25.7)    IV Piggyback 1350    Total Intake(mL/kg) 3101.5 (45.6)    Urine (mL/kg/hr) 85 (0.1)    Total Output 85     Net +3016.5 0        Stool Occurrence 1 x      PHYSICAL EXAMINATION: General: no distress Neuro:  Alert, normal strength,  follows commands, no asterexis HEENT:  Jaundice, no sinus tenderness Cardiovascular:  Regular, no murmur Lungs:  Decreased BS at bases, no wheeze Abdomen:  Distended, + fluid wave, non tender Musculoskeletal:  1+ leg edema Skin:  jaundice  LABS:  CBC  Recent Labs Lab 02/17/13 0545  WBC 37.2*  HGB 12.7  HCT 34.3*  PLT 156   Coag's No results found for this basename: APTT, INR,  in the last 168 hours BMET  Recent Labs Lab 02/20/13 0420 02/21/13 0445 02/22/13 0545  NA 118* 119* 119*  K 2.9* 3.5 3.9  CL 85* 86* 82*  CO2 19 19 23   BUN 60* 64* 76*  CREATININE 1.70* 1.84* 2.25*  GLUCOSE 157* 124* 105*   Electrolytes  Recent Labs Lab 02/17/13 0425  02/20/13 0420 02/21/13 0445 02/22/13 0545  CALCIUM 7.2*  < > 7.2* 6.6* 7.7*  MG 2.4  --  2.2  --   --   PHOS 5.6*  --  4.0  --   --   < > = values in this interval not displayed. Sepsis Markers No results found for this basename: LATICACIDVEN, PROCALCITON, O2SATVEN,  in the last 168 hours ABG No results found for this basename: PHART, PCO2ART, PO2ART,  in the last 168 hours Liver Enzymes  Recent Labs Lab 02/18/13 0530 02/19/13 0500 02/21/13 0445  AST 204* 287* 179*  ALT 103* 153* 118*  ALKPHOS  90 100 102  BILITOT 27.3* 29.3* 23.6*  ALBUMIN 2.8* 2.5* 2.1*   Cardiac Enzymes No results found for this basename: TROPONINI, PROBNP,  in the last 168 hours Glucose  Recent Labs Lab 02/19/13 1612  GLUCAP 147*    Imaging No results found.   ASSESSMENT / PLAN:  PULMONARY A: Passive atelectasis. Tobacco abuse. Orthodeoxia - ? related to porto -pulm htn P:   -flutter valve -smoking cessation; continue nicoderm  CARDIOVASCULAR A: Shock ? 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 Hyponatremia. P:   - rx per renal   GASTROINTESTINAL A:   ETOH cirrhosis with jaundice. P:    -protonix for SUP -GI following -defer paracentesis for fear of  worsening renal fn, perform only if breathing worse -Is she a TIPS candidate? - lactulose -restart only if encephalopathy  HEMATOLOGIC A:   Leucocytosis ? steroids P:  -f/u CBC, coag's -SCD for DVT prevention  INFECTIOUS A:   E coli UTI. Trichomonas. P:   -continue cipro for SBp proph after 12/19  -dc flagyl 12/19  ENDOCRINE A:  Cortisol ok, TSH high ? hypothyroid  P:  Ct low dose levothyroxine  NEUROLOGIC A:   ETOH abuse >> last drink reported to be 10 days prior to admission. P:   -but low likelihood of DTs this far out from her last drink -Low dose ativan for anxiety       Sandrea Hughs, MD Pulmonary and Critical Care Medicine [facility] Cell [phone] After 5:30 PM or weekends, call [phone]

## 2013-02-22 NOTE — Progress Notes (Addendum)
47 yo with progressive hepatorenal failure. Prognosis extremely poor.  Met with patient and her Evorn Gong to further discuss goals of care after request from Dr. Arrie Aran that Anaise was requesting comfort and progressively declining. After speaking with Casimiro Needle and Hobbs have elected for DNR, comfort and medical interventions that are reasonable and treat reversible conditions if they can help restore QOL.She looks very weak, increased WOB. She is asking for relief-Michael supports her in that request-I will give her some ativan and place a low dose fentanyl patch since she is in liver failure- IV fentanyl for breakthrough pain and dyspnea in liver failure preferred. Will continue to follow closely-she may not survive this hospitalization.  25 minutes. Greater than 50%  of this time was spent counseling and coordinating care related to the above assessment and plan.  Anderson Malta, DO Palliative Medicine

## 2013-02-22 NOTE — Progress Notes (Signed)
Patient ID: Teresa House, female   DOB: 01/14/1966, 47 y.o.   MRN: [id number] S:feels miserable, upset to hear that renal function is worse as well as the ascites.  "let me croak" O:BP 96/62  Pulse 85  Temp(Src) 98.2 F (36.8 C) (Oral)  Resp 18  Ht 5\' 6"  (1.676 m)  Wt 68.04 kg (150 lb)  BMI 24.22 kg/m2  SpO2 94%  Intake/Output Summary (Last 24 hours) at 02/22/13 1043 Last data filed at 02/22/13 0900  Gross per 24 hour  Intake 2165.38 ml  Output     65 ml  Net 2100.38 ml   Intake/Output: I/O last 3 completed shifts: In: 3651.5 [I.V.:2301.5; IV Piggyback:1350] Out: 205 [Urine:205]  Intake/Output this shift:    Weight change: -5.46 kg (-12 lb 0.6 oz) YQM:VHQIONGEX, ill-appearing WF CVS:no rub Resp:decreased BS BMW:[id number], +fluid wave Ext:2+ anasarca   Recent Labs Lab 02/16/13 1005 02/17/13 0425 02/18/13 0530 02/19/13 0500 02/20/13 0420 02/21/13 0445 02/22/13 0545  NA 115* 112* 118* 120* 118* 119* 119*  K 4.8 4.5 3.6 3.0* 2.9* 3.5 3.9  CL 85* 83* 87* 88* 85* 86* 82*  CO2 14* 12* 15* 16* 19 19 23   GLUCOSE 112* 153* 145* 159* 157* 124* 105*  BUN 54* 63* 57* 57* 60* 64* 76*  CREATININE 2.23* 2.35* 2.01* 1.73* 1.70* 1.84* 2.25*  ALBUMIN 1.5* 2.1* 2.8* 2.5*  --  2.1*  --   CALCIUM 7.4* 7.2* 7.0* 7.0* 7.2* 6.6* 7.7*  PHOS  --  5.6*  --   --  4.0  --   --   AST 210* 181* 204* 287*  --  179*  --   ALT 116* 112* 103* 153*  --  118*  --    Liver Function Tests:  Recent Labs Lab 02/18/13 0530 02/19/13 0500 02/21/13 0445  AST 204* 287* 179*  ALT 103* 153* 118*  ALKPHOS 90 100 102  BILITOT 27.3* 29.3* 23.6*  PROT 4.5* 4.2* 3.9*  ALBUMIN 2.8* 2.5* 2.1*   No results found for this basename: LIPASE, AMYLASE,  in the last 168 hours  Recent Labs Lab 02/16/13 1816  AMMONIA 27   CBC:  Recent Labs Lab 02/17/13 0545  WBC 37.2*  HGB 12.7  HCT 34.3*  MCV 104.6*  PLT 156   Cardiac Enzymes: No results found for this basename: CKTOTAL, CKMB, CKMBINDEX,  TROPONINI,  in the last 168 hours CBG:  Recent Labs Lab 02/19/13 1612  GLUCAP 147*    Iron Studies: No results found for this basename: IRON, TIBC, TRANSFERRIN, FERRITIN,  in the last 72 hours Studies/Results: No results found. Marland Kitchen antiseptic oral rinse  15 mL Mouth Rinse BID  . ciprofloxacin  400 mg Intravenous Q12H  . levothyroxine  50 mcg Oral QAC breakfast  . midodrine  10 mg Oral TID WC  . nicotine  21 mg Transdermal Q24H  . octreotide  100 mcg Subcutaneous BID  . sodium chloride  3 mL Intravenous Q12H    BMET    Component Value Date/Time   NA 119* 02/22/2013 0545   K 3.9 02/22/2013 0545   CL 82* 02/22/2013 0545   CO2 23 02/22/2013 0545   GLUCOSE 105* 02/22/2013 0545   BUN 76* 02/22/2013 0545   CREATININE 2.25* 02/22/2013 0545   CALCIUM 7.7* 02/22/2013 0545   GFRNONAA 25* 02/22/2013 0545   GFRAA 29* 02/22/2013 0545   CBC    Component Value Date/Time   WBC 37.2* 02/17/2013 0545   RBC 3.28* 02/17/2013 0545  RBC 4.04 07/15/2008 1248   HGB 12.7 02/17/2013 0545   HCT 34.3* 02/17/2013 0545   PLT 156 02/17/2013 0545   MCV 104.6* 02/17/2013 0545   MCH 38.7* 02/17/2013 0545   MCHC 37.0* 02/17/2013 0545   RDW 15.1 02/17/2013 0545   LYMPHSABS 0.8 02/15/2013 1851   MONOABS 1.1* 02/08/2013 1851   EOSABS 0.1 03/02/2013 1851   BASOSABS 0.0 02/17/2013 1851     Assessment/Plan:  1. AKI: hepatorenal syndrome vs ischemic ATN in setting of large volume paracentesis and 3rd spacing with decreased renal perfusion. Agree with treatment for HRS unfortunately she is now oliguric. Overall prognosis remains poor however pt is not able to accept the severity of her illness and cont to wish for aggressive measures.  1. Stopped vasopressin and weaned levophed, transferred to the floor yesterday.   2. Remains anuric/oliguric with worsening azotemia despite IV albumin and Lasix 3. ascites is worse after IVF but Scr has improved initially, now increased Scr off of IVF's. 4. Now on  Octreotide/midodrine and consider aldactone given anasarca and hypokalemia 5. I would not offer CVVHD since she is not House transplant candidate and pt is now considering hospice. Will discuss with PCCM and palliative care.   2. Hyponatremia- secondary to cirrhosis. Stable but with worsening ascites/anasarca.  3. Metabolic acidosis- responding to bicarb drip (will administer slowly given ascites)  4. Decompensated Cirrhosis- secondary to Etoh and Hep C. 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 She is not House liver transplant candidate given her recent Etoh use and agree with palliative care consult to help set goals/limits of care.  5. Dispo- agree with GI. Prognosis grim.  Now amenable to hospice services. Await Palliative care input today.   6.   Teresa House

## 2013-02-22 NOTE — Progress Notes (Signed)
Physical Therapy Treatment Patient Details Name: Teresa House MRN: [id number] DOB: Feb 01, 1966 Today's Date: 02/22/2013 Time: 5284-1324 PT Time Calculation (min): 11 min  PT Assessment / Plan / Recommendation  History of Present Illness Patient is a 47 y.o. female admitted with jaudice and worsening abdominal pain.  Found to be severely hyponatremic and with renal failure.   PT Comments   Pt was able to transfer OOB today with stand pivot transfer to chair. Pt requires max encouragement to participate and can be self limiting. Her significant other is a Occupational psychologist. Discharge disposition remains to be determined. Pt requried 2+ (A) for transfers and would require increased (A) of caregiver at this time. Will cont to follow per POC.  Follow Up Recommendations  Other (comment) (TBD )     Does the patient have the potential to tolerate intense rehabilitation     Barriers to Discharge        Equipment Recommendations  Rolling walker with 5" wheels    Recommendations for Other Services    Frequency Min 3X/week   Progress towards PT Goals Progress towards PT goals: Progressing toward goals  Plan Current plan remains appropriate    Precautions / Restrictions Precautions Precautions: Fall Restrictions Weight Bearing Restrictions: No   Pertinent Vitals/Pain 8/10 in abdomen; RN notified. patient repositioned for comfort    Mobility  Bed Mobility Bed Mobility: Rolling Right;Right Sidelying to Sit Rolling Right: 2: Max assist;With rail Right Sidelying to Sit: 2: Max assist;HOB elevated;With rails Details for Bed Mobility Assistance: signifant other (A) also; pt requires (A) to roll and to bring LEs off EOB; pt able to intialize movement to sitting position but is unable to acheive without max (A); max cues for hand placement and sequencing  Transfers Transfers: Sit to Stand;Stand to Sit;Stand Pivot Transfers Sit to Stand: 1: +2 Total assist;From bed;With upper extremity  assist Sit to Stand: Patient Percentage: 40% Stand to Sit: 1: +2 Total assist;To chair/3-in-1;With armrests;With upper extremity assist Stand to Sit: Patient Percentage: 40% Stand Pivot Transfers: 1: +2 Total assist;From elevated surface;With armrests Stand Pivot Transfers: Patient Percentage: 40% Details for Transfer Assistance: pt requries 2+ (A) to maintain balance and safely achieve transfers; pt attempts to pull up on therapist and tech; max cues for hand placement and safety; c/o pain with activity  Ambulation/Gait Ambulation/Gait Assistance: Not tested (comment)         PT Diagnosis:    PT Problem List:   PT Treatment Interventions:     PT Goals (current goals can now be found in the care plan section) Acute Rehab PT Goals Patient Stated Goal: To go home per significant other PT Goal Formulation: With patient/family Time For Goal Achievement: 03/07/13 Potential to Achieve Goals: Fair  Visit Information  Last PT Received On: 02/22/13 Assistance Needed: +2 (transfers and ambulating) History of Present Illness: Patient is a 47 y.o. female admitted with jaudice and worsening abdominal pain.  Found to be severely hyponatremic and with renal failure.    Subjective Data  Subjective: pt lying supine; on bedpan; signficant other present encouraging her to get out of bed; pt agreeable. " i feel like crap"   Patient Stated Goal: To go home per significant other   Cognition  Cognition Arousal/Alertness: Awake/alert Behavior During Therapy: WFL for tasks assessed/performed Overall Cognitive Status: Impaired/Different from baseline Area of Impairment: Problem solving Memory: Decreased short-term memory Problem Solving: Slow processing General Comments: requires incr time for motor processing     Balance  Balance Balance  Assessed: Yes Static Sitting Balance Static Sitting - Balance Support: Feet supported;Bilateral upper extremity supported Static Sitting - Level of Assistance:  5: Stand by assistance Static Sitting - Comment/# of Minutes: pt tolerated sitting EOB ~4 min to prepare for transfer  End of Session PT - End of Session Equipment Utilized During Treatment: Gait belt Activity Tolerance: Patient limited by pain;Patient limited by fatigue Patient left: in chair;with call bell/phone within reach;with family/visitor present Nurse Communication: Mobility status;Precautions   GP     Donell Sievert, [place] [phone] 02/22/2013, 3:40 PM

## 2013-02-23 MED ORDER — HYDROMORPHONE HCL PF 1 MG/ML IJ SOLN
0.5000 mg | INTRAMUSCULAR | Status: DC | PRN
  Administered 2013-02-24 – 2013-02-25 (×4): 0.5 mg via INTRAVENOUS
  Filled 2013-02-23 (×5): qty 1

## 2013-02-23 MED ORDER — SODIUM CHLORIDE 0.9 % IJ SOLN
10.0000 mL | INTRAMUSCULAR | Status: DC | PRN
  Administered 2013-02-23: 20 mL
  Administered 2013-02-23: 30 mL
  Administered 2013-02-24: 20 mL
  Administered 2013-02-25 (×3): 10 mL
  Administered 2013-02-26: 20 mL

## 2013-02-23 NOTE — Progress Notes (Signed)
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  She developed renal failure with concern for hepatorenal syndrome.  She was transferred to ICU for levophed therapy, and PCCM assumed care.  SIGNIFICANT EVENTS: 12/10 Paracentesis >> 2.4 liters, GI consulted 12/14 Renal consulted, transfer to ICU for levophed therapy  STUDIES:  12/09 Abd u/s >> cirrhosis, ascites, normal kidneys, gallbladder sludge 12/09 Heptobiliary scan >> Indeterminate hepatobiliary scan with no visualization of the biliary tree secondary to severe hepatocellular disease. Acute hep panel 12/09 >> HCV Ab reactive HIV 12/10 >> non reactive 12/10 Urine Na < 10, Urine osmo 257 Hep C viral load 12/11 >> negative  LINES / TUBES: CVL 12/14 >>   CULTURES: U/A 12/09 >> trichomonas Urine 12/09 >> E coli  ANTIBIOTICS: Rocephin 12/09 >> 12/09 Flagyl 12/09 >> 12/19 Cipro 12/09 >>    SUBJECTIVE:  C/o  Dyspnea and mild abd fullness slt worse     VITAL SIGNS: Temp:  [97.5 F (36.4 C)-98.6 F (37 C)] 98.6 F (37 C) (12/21 1000) Pulse Rate:  [84-98] 98 (12/21 1000) Resp:  [18] 18 (12/21 1000) BP: (73-94)/(52-62) 81/55 mmHg (12/21 1000) SpO2:  [90 %-100 %] 92 % (12/21 1000) Weight:  [154 lb 8 oz (70.081 kg)] 154 lb 8 oz (70.081 kg) (12/20 2100)  02  Rx  2lpm NP   INTAKE / OUTPUT: Intake/Output     12/20 0701 - 12/21 0700 12/21 0701 - 12/22 0700   P.O. 180 0   I.V. (mL/kg) 1150 (16.4)    IV Piggyback 200    Total Intake(mL/kg) 1530 (21.8)    Urine (mL/kg/hr)     Total Output       Net +1530 0        Stool Occurrence  1 x     PHYSICAL EXAMINATION: General: no distress Neuro:  Alert, normal  strength, follows commands, no asterexis HEENT:  Jaundice, no sinus tenderness Cardiovascular:  Regular, no murmur Lungs:  Decreased BS at bases, no wheeze Abdomen:  Distended, + fluid wave, non tender Musculoskeletal:  1+ leg edema Skin:  jaundice  LABS:  CBC  Recent Labs Lab 02/17/13 0545  WBC 37.2*  HGB 12.7  HCT 34.3*  PLT 156   Coag's No results found for this basename: APTT, INR,  in the last 168 hours BMET  Recent Labs Lab 02/20/13 0420 02/21/13 0445 02/22/13 0545  NA 118* 119* 119*  K 2.9* 3.5 3.9  CL 85* 86* 82*  CO2 19 19 23   BUN 60* 64* 76*  CREATININE 1.70* 1.84* 2.25*  GLUCOSE 157* 124* 105*   Electrolytes  Recent Labs Lab 02/17/13 0425  02/20/13 0420 02/21/13 0445 02/22/13 0545  CALCIUM 7.2*  < > 7.2* 6.6* 7.7*  MG 2.4  --  2.2  --   --   PHOS 5.6*  --  4.0  --   --   < > = values in this interval not displayed. Sepsis Markers No results found for this basename: LATICACIDVEN, PROCALCITON, O2SATVEN,  in the last 168 hours ABG No results found for this basename: PHART, PCO2ART, PO2ART,  in the last 168 hours Liver Enzymes  Recent Labs Lab 02/18/13 0530 02/19/13 0500 02/21/13 0445  AST 204* 287* 179*  ALT 103* 153* 118*  ALKPHOS 90 100 102  BILITOT 27.3* 29.3* 23.6*  ALBUMIN 2.8* 2.5* 2.1*   Cardiac Enzymes No results found for this basename: TROPONINI, PROBNP,  in the last 168 hours Glucose  Recent Labs Lab 02/19/13 1612  GLUCAP 147*    Imaging No results found.   ASSESSMENT / PLAN:  PULMONARY A: Passive atelectasis. Tobacco abuse. Orthodeoxia - ? related to porto -pulm htn P:   -flutter valve -smoking cessation; continue nicoderm  CARDIOVASCULAR A: Shock ? 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 Hyponatremia. P:   - rx per renal   GASTROINTESTINAL A:   ETOH cirrhosis with jaundice. P:    -protonix for SUP -GI following -defer paracentesis for fear  of worsening renal fn, perform only if breathing worse -Is she a TIPS candidate? - lactulose -restart only if encephalopathy  HEMATOLOGIC A:   Leucocytosis ? steroids P:  -f/u CBC, coag's -SCD for DVT prevention  INFECTIOUS A:   E coli UTI. Trichomonas. P:   -continue cipro for SBP proph after 12/19  -dcd flagyl 12/19  ENDOCRINE A:  Cortisol ok, TSH high ? hypothyroid  P:  Ct low dose levothyroxine  NEUROLOGIC A:   ETOH abuse >> last drink reported to be 10 days prior to admission. P:   -but low likelihood of DTs this far out from her last drink -Low dose ativan for anxiety       Sandrea Hughs, MD Pulmonary and Critical Care Medicine [facility] Cell [phone] After 5:30 PM or weekends, call [phone]

## 2013-02-23 NOTE — Progress Notes (Signed)
Patient ID: Teresa House, female   DOB: Apr 19, 1965, 47 y.o.   MRN: [id number] S:c/o HA O:BP 81/55  Pulse 98  Temp(Src) 98.6 F (37 C) (Oral)  Resp 18  Ht 5\' 6"  (1.676 m)  Wt 70.081 kg (154 lb 8 oz)  BMI 24.95 kg/m2  SpO2 92%  Intake/Output Summary (Last 24 hours) at 02/23/13 1026 Last data filed at 02/23/13 0900  Gross per 24 hour  Intake   1530 ml  Output      0 ml  Net   1530 ml   Intake/Output: I/O last 3 completed shifts: In: 2580 [P.O.:180; I.V.:1750; IV Piggyback:650] Out: 25 [Urine:25]  Intake/Output this shift:    Weight change: 2.041 kg (4 lb 8 oz) NWG:NFAOZHYQM WF in mild distress, +anasarca CVS:no rub Resp:decreased BS at bases VHQ:IONGEXBM distended, +fluid wave, tender Ext:3+edema   Recent Labs Lab 02/17/13 0425 02/18/13 0530 02/19/13 0500 02/20/13 0420 02/21/13 0445 02/22/13 0545  NA 112* 118* 120* 118* 119* 119*  K 4.5 3.6 3.0* 2.9* 3.5 3.9  CL 83* 87* 88* 85* 86* 82*  CO2 12* 15* 16* 19 19 23   GLUCOSE 153* 145* 159* 157* 124* 105*  BUN 63* 57* 57* 60* 64* 76*  CREATININE 2.35* 2.01* 1.73* 1.70* 1.84* 2.25*  ALBUMIN 2.1* 2.8* 2.5*  --  2.1*  --   CALCIUM 7.2* 7.0* 7.0* 7.2* 6.6* 7.7*  PHOS 5.6*  --   --  4.0  --   --   AST 181* 204* 287*  --  179*  --   ALT 112* 103* 153*  --  118*  --    Liver Function Tests:  Recent Labs Lab 02/18/13 0530 02/19/13 0500 02/21/13 0445  AST 204* 287* 179*  ALT 103* 153* 118*  ALKPHOS 90 100 102  BILITOT 27.3* 29.3* 23.6*  PROT 4.5* 4.2* 3.9*  ALBUMIN 2.8* 2.5* 2.1*   No results found for this basename: LIPASE, AMYLASE,  in the last 168 hours  Recent Labs Lab 02/16/13 1816  AMMONIA 27   CBC:  Recent Labs Lab 02/17/13 0545  WBC 37.2*  HGB 12.7  HCT 34.3*  MCV 104.6*  PLT 156   Cardiac Enzymes: No results found for this basename: CKTOTAL, CKMB, CKMBINDEX, TROPONINI,  in the last 168 hours CBG:  Recent Labs Lab 02/19/13 1612  GLUCAP 147*    Iron Studies: No results found for  this basename: IRON, TIBC, TRANSFERRIN, FERRITIN,  in the last 72 hours Studies/Results: No results found. Marland Kitchen antiseptic oral rinse  15 mL Mouth Rinse BID  . ciprofloxacin  500 mg Oral Q breakfast  . fentaNYL  12.5 mcg Transdermal Q72H  . levothyroxine  50 mcg Oral QAC breakfast  . midodrine  10 mg Oral TID WC  . nicotine  21 mg Transdermal Q24H  . octreotide  100 mcg Subcutaneous BID  . sodium chloride  3 mL Intravenous Q12H    BMET    Component Value Date/Time   NA 119* 02/22/2013 0545   K 3.9 02/22/2013 0545   CL 82* 02/22/2013 0545   CO2 23 02/22/2013 0545   GLUCOSE 105* 02/22/2013 0545   BUN 76* 02/22/2013 0545   CREATININE 2.25* 02/22/2013 0545   CALCIUM 7.7* 02/22/2013 0545   GFRNONAA 25* 02/22/2013 0545   GFRAA 29* 02/22/2013 0545   CBC    Component Value Date/Time   WBC 37.2* 02/17/2013 0545   RBC 3.28* 02/17/2013 0545   RBC 4.04 07/15/2008 1248   HGB 12.7 02/17/2013 0545  HCT 34.3* 02/17/2013 0545   PLT 156 02/17/2013 0545   MCV 104.6* 02/17/2013 0545   MCH 38.7* 02/17/2013 0545   MCHC 37.0* 02/17/2013 0545   RDW 15.1 02/17/2013 0545   LYMPHSABS 0.8 10-Mar-2013 1851   MONOABS 1.1* 03-10-2013 1851   EOSABS 0.1 2013-03-10 1851   BASOSABS 0.0 10-Mar-2013 1851     Assessment/Plan:  1. AKI: hepatorenal syndrome vs ischemic ATN in setting of large volume paracentesis and 3rd spacing with decreased renal perfusion. Agree with treatment for HRS unfortunately she remains oliguric. Overall prognosis remains poor however pt is not able to accept the severity of her illness and cont to wish for aggressive measures.  1. Stopped vasopressin and weaned levophed, transferred to the floor yesterday.  2. Remains anuric/oliguric with worsening azotemia despite IV albumin and Lasix 3. ascites is worse after IVF but Scr has improved initially, now increased Scr off of IVF's. 4. Now on Octreotide/midodrine and consider aldactone given anasarca and hypokalemia 5. I would not offer  CVVHD since she is not a transplant candidate and pt is now considering hospice. She is DNR and considering CMO. 2. Hyponatremia- secondary to cirrhosis. Stable but with worsening ascites/anasarca. Will stop IVF's 3. Metabolic acidosis- responding to bicarb drip (will administer slowly given ascites)  4. Decompensated Cirrhosis- secondary to Etoh and Hep C. 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 She is not a liver transplant candidate given her recent Etoh use and agree with palliative care consult to help set goals/limits of care.  5. Dispo- agree with GI. Prognosis grim. Now amenable to hospice services. Await Palliative care input for today.   6.   Lasharn Bufkin A

## 2013-02-24 DIAGNOSIS — N179 Acute kidney failure, unspecified: Secondary | ICD-10-CM

## 2013-02-24 DIAGNOSIS — Z515 Encounter for palliative care: Secondary | ICD-10-CM

## 2013-02-24 DIAGNOSIS — R188 Other ascites: Secondary | ICD-10-CM

## 2013-02-24 DIAGNOSIS — K769 Liver disease, unspecified: Secondary | ICD-10-CM

## 2013-02-24 MED ORDER — FENTANYL 12 MCG/HR TD PT72
12.5000 ug | MEDICATED_PATCH | TRANSDERMAL | Status: DC
  Administered 2013-02-24: 12.5 ug via TRANSDERMAL
  Filled 2013-02-24: qty 1

## 2013-02-24 NOTE — Progress Notes (Signed)
Progress Note from the Palliative Medicine Team at [facility]  Subjective: I spoke with Kathlene November this morning while Teresa House slept. She did not have the energy to engage this morning when I visited. Kathlene November is now interested in a focus on comfort care and limiting anything that might prolong her suffering. He clearly stated that he knew her liver and kidneys are failing and that she is why she is declining and dying. He did express interest in continuing the Midodrine and I explained that she will become more and more lethargic and likely not be able to swallow any pills soon. We agreed to give her the Midodrine if she is awake to take it but he is comfortable with the fact that she probably will not be able to continue this long. He did express that he did not think she will "live to see Christmas." He is realistic with her poor prognosis and inevitable death. He is struggling with the reality of this fact and says many times that he has no control over what happens. We discussed utilizing a continuous infusion for pain control if needed and he was ok with this if needed - "whatever you need to keep her comfortable." He says that her daughter and mother came last night from out of town but returned home after visiting. He is at her bedside and I encouraged him to hold her home and speak to her when she is awake. We will continue to support them both holistically.  I spoke with nursing about utilizing her Dilaudid to control pain and dyspnea as she did not appear comfortable when I was in the room. I asked them to call if she needs more relief. Will D/C Cipro and Synthroid as discussed with Kathlene November about not prolonging suffering and her inability to take po. We will continue to follow and support them holistically.   Objective: Allergies  Allergen Reactions  . Penicillins Other (See Comments)    Childhood reaction    Scheduled Meds: . antiseptic oral rinse  15 mL Mouth Rinse BID  . ciprofloxacin  500 mg  Oral Q breakfast  . fentaNYL  12.5 mcg Transdermal Q72H  . levothyroxine  50 mcg Oral QAC breakfast  . midodrine  10 mg Oral TID WC  . nicotine  21 mg Transdermal Q24H  . octreotide  100 mcg Subcutaneous BID  . sodium chloride  3 mL Intravenous Q12H   Continuous Infusions:  PRN Meds:.HYDROmorphone (DILAUDID) injection, LORazepam, LORazepam, ondansetron (ZOFRAN) IV, ondansetron, sodium chloride  BP 72/43  Pulse 94  Temp(Src) 97.5 F (36.4 C) (Oral)  Resp 18  Ht 5\' 6"  (1.676 m)  Wt 70.081 kg (154 lb 8 oz)  BMI 24.95 kg/m2  SpO2 88%   PPS: 20%  Pain Score: nonverbal    Intake/Output Summary (Last 24 hours) at 02/24/13 1319 Last data filed at 02/23/13 1700  Gross per 24 hour  Intake      0 ml  Output      0 ml  Net      0 ml      LBM: 02/23/13  Physical Exam:  General: NAD, moaning, jaundice HEENT:  [place]/AT Chest: Labored respirations, symmetric CVS: RRR Abdomen: ascites, distended, tender Ext: ++edema BLE Neuro: Lethargic, responds to voice, follows commands  Labs: CBC    Component Value Date/Time   WBC 37.2* 02/17/2013 0545   RBC 3.28* 02/17/2013 0545   RBC 4.04 07/15/2008 1248   HGB 12.7 02/17/2013 0545   HCT 34.3*  02/17/2013 0545   PLT 156 02/17/2013 0545   MCV 104.6* 02/17/2013 0545   MCH 38.7* 02/17/2013 0545   MCHC 37.0* 02/17/2013 0545   RDW 15.1 02/17/2013 0545   LYMPHSABS 0.8 2013/03/08 1851   MONOABS 1.1* 03/08/2013 1851   EOSABS 0.1 03-08-13 1851   BASOSABS 0.0 03/08/2013 1851    BMET    Component Value Date/Time   NA 119* 02/22/2013 0545   K 3.9 02/22/2013 0545   CL 82* 02/22/2013 0545   CO2 23 02/22/2013 0545   GLUCOSE 105* 02/22/2013 0545   BUN 76* 02/22/2013 0545   CREATININE 2.25* 02/22/2013 0545   CALCIUM 7.7* 02/22/2013 0545   GFRNONAA 25* 02/22/2013 0545   GFRAA 29* 02/22/2013 0545    CMP     Component Value Date/Time   NA 119* 02/22/2013 0545   K 3.9 02/22/2013 0545   CL 82* 02/22/2013 0545   CO2 23 02/22/2013 0545    GLUCOSE 105* 02/22/2013 0545   BUN 76* 02/22/2013 0545   CREATININE 2.25* 02/22/2013 0545   CALCIUM 7.7* 02/22/2013 0545   PROT 3.9* 02/21/2013 0445   ALBUMIN 2.1* 02/21/2013 0445   AST 179* 02/21/2013 0445   ALT 118* 02/21/2013 0445   ALKPHOS 102 02/21/2013 0445   BILITOT 23.6* 02/21/2013 0445   GFRNONAA 25* 02/22/2013 0545   GFRAA 29* 02/22/2013 0545      Assessment and Plan: 1. Code Status: DNR 2. Symptom Control: 1. Pain: Educated nursing on utilization of Dilaudid prn. Fentanyl duragesic patch in place.  2. Anxiety/Agitation: Lorazepam prn.  3. Nausea: Ondansetron prn.  3. Psycho/Social: Will continue to support holistically. 4. Disposition: Anticipate hospital death at this point. Consider GIP if needed.     Time In Time Out Total Time Spent with Patient Total Overall Time  1245 1325     Greater than 50%  of this time was spent counseling and coordinating care related to the above assessment and plan.  Yong Channel, NP Palliative Medicine Team Team Phone # [phone]   1

## 2013-02-24 NOTE — Progress Notes (Signed)
I see plans for comfort care. Renal will sign off. Let us know if we can be of any further assistance  Aireonna Bauer A

## 2013-02-24 NOTE — Clinical Social Work Note (Signed)
Clinical Social Work Department BRIEF PSYCHOSOCIAL ASSESSMENT 02/24/2013  Patient:  Teresa House, Teresa House     Account Number:  [id number]     Admit date:  03/02/2013  Clinical Social Worker:  Verl Blalock  Date/Time:  02/24/2013 02:45 PM  Referred by:  Care Management  Date Referred:  02/24/2013 Referred for  Residential hospice placement  Other - See comment   Other Referral:   Comfort care / cremation resources   Interview type:  Family Other interview type:   Patient significant other at bedside    PSYCHOSOCIAL DATA Living Status:  SIGNIFICANT OTHER Admitted from facility:   Level of care:   Primary support name:  Teresa House,Teresa House   [phone] Primary support relationship to patient:  PARTNER Degree of support available:   Strong    CURRENT CONCERNS Current Concerns  Adjustment to Illness  Financial Resources   Other Concerns:    SOCIAL WORK ASSESSMENT / PLAN Clinical Social Worker met with patient significant other at bedside to offer support and discuss patient needs. Patient significant other states that he has come to terms with the idea that patient is going to pass and just wants her to be comfortable.  Patient significant other was tearful stating that he will be alone through the holidays, however his family is going to come and visit following the new year.  Patient significant other is aware that patient wishes are for cremation, however he states "we don't have a dime, not even a bank account."  CSW to further explore resources and update patient significant other.  Per RN, patient may have an in hospital death - CSW to further explore residential options if palliative medicine feel this is appropriate and patient stable for transfer.   Assessment/plan status:  Psychosocial Support/Ongoing Assessment of Needs Other assessment/ plan:   Information/referral to community resources:   Clinical Social Worker to provide patient significant other with resources  regarding cremation as available.    PATIENT'S/FAMILY'S RESPONSE TO PLAN OF CARE: Patient alert and oriented to person and place.  Patient did not engage in conversation with CSW while speaking with significant other.  Patient family is not local and patient significant other plans to bring them patient remains following her death.  Patient significant other tearful and just wants her to be comfortable.  Patient SO understanding of social work role and appreciative of support and concern.

## 2013-02-24 NOTE — Progress Notes (Signed)
PULMONARY  / CRITICAL CARE MEDICINE  Name: Teresa House MRN: [id number] DOB: [birth year]    ADMISSION DATE:  22-Feb-2013 CONSULTATION DATE:  02/16/2013  REFERRING MD :  Kathlen Mody  CHIEF COMPLAINT:  Yellow  BRIEF PATIENT DESCRIPTION:  47 yo female smoker with cirrhosis from ETOH admitted with progressive jaundice, bloating, and abdominal discomfort.  She developed renal failure with concern for hepatorenal syndrome.  Not transplant candidate. She was transferred to ICU for levophed therapy, and PCCM assumed care.  Renal function worsening, now transitioned to comfort care after meetings with palliative care.   SIGNIFICANT EVENTS: 12/10 Paracentesis >> 2.4 liters, GI consulted 12/14 Renal consulted, transfer to ICU for levophed therapy 12/20 - goals of care -- pt now DNR, comfort care  STUDIES:  12/09 Abd u/s >> cirrhosis, ascites, normal kidneys, gallbladder sludge 12/09 Heptobiliary scan >> Indeterminate hepatobiliary scan with no visualization of the biliary tree secondary to severe hepatocellular disease. Acute hep panel 12/09 >> HCV Ab reactive HIV 12/10 >> non reactive 12/10 Urine Na < 10, Urine osmo 257 Hep C viral load 12/11 >> negative  LINES / TUBES: CVL 12/14 >>   CULTURES: U/A 12/09 >> trichomonas Urine 12/09 >> E coli  ANTIBIOTICS: Rocephin 12/09 >> 12/09 Flagyl 12/09 >> 12/19 Cipro 12/09 >>    SUBJECTIVE:  No c/o.  Appears comfortable.     VITAL SIGNS: Temp:  [97.5 F (36.4 C)-98.6 F (37 C)] 97.5 F (36.4 C) (12/22 0500) Pulse Rate:  [84-99] 94 (12/22 0500) Resp:  [18] 18 (12/21 2100) BP: (72-92)/(42-62) 72/43 mmHg (12/22 0500) SpO2:  [88 %-98 %] 88 % (12/22 0500)  02  Rx  2lpm [place]   INTAKE / OUTPUT: Intake/Output     12/21 0701 - 12/22 0700 12/22 0701 - 12/23 0700   P.O. 0    I.V. (mL/kg)     IV Piggyback     Total Intake(mL/kg)     Net 0          Stool Occurrence 3 x      PHYSICAL EXAMINATION: General: no distress Neuro:   Lethargic, opens eyes to loud voice HEENT:  Jaundice, no sinus tenderness Cardiovascular:  Regular, no murmur Lungs:  resps even, non labored, Decreased BS at bases, no wheeze Abdomen:  Distended, + fluid wave, non tender Musculoskeletal:  2+ leg edema Skin:  jaundice  LABS:  CBC No results found for this basename: WBC, HGB, HCT, PLT,  in the last 168 hours Coag's No results found for this basename: APTT, INR,  in the last 168 hours BMET  Recent Labs Lab 02/20/13 0420 02/21/13 0445 02/22/13 0545  NA 118* 119* 119*  K 2.9* 3.5 3.9  CL 85* 86* 82*  CO2 19 19 23   BUN 60* 64* 76*  CREATININE 1.70* 1.84* 2.25*  GLUCOSE 157* 124* 105*   Electrolytes  Recent Labs Lab 02/20/13 0420 02/21/13 0445 02/22/13 0545  CALCIUM 7.2* 6.6* 7.7*  MG 2.2  --   --   PHOS 4.0  --   --    Sepsis Markers No results found for this basename: LATICACIDVEN, PROCALCITON, O2SATVEN,  in the last 168 hours ABG No results found for this basename: PHART, PCO2ART, PO2ART,  in the last 168 hours Liver Enzymes  Recent Labs Lab 02/18/13 0530 02/19/13 0500 02/21/13 0445  AST 204* 287* 179*  ALT 103* 153* 118*  ALKPHOS 90 100 102  BILITOT 27.3* 29.3* 23.6*  ALBUMIN 2.8* 2.5* 2.1*   Cardiac Enzymes No results  found for this basename: TROPONINI, PROBNP,  in the last 168 hours Glucose  Recent Labs Lab 02/19/13 1612  GLUCAP 147*    Imaging No results found.   ASSESSMENT / PLAN:   Probable Hepatorenal sx  Acute renal failure - oliguric.  CVVHD not option since she is not transplant candidate.  Decompensated ETOH Cirrhosis  Ascites  Hyponatremia  Metabolic acidosis  EColi UTI  Trichomonas  ?hypothyroid    Pt/husband met with palliative care 12/20.  Pt now DNR/DNI with comfort as main focus of care.   They wish to continue reasonable medical rx including abx etc but no further pressors, no CVVHD etc.  Pt now resting comfortably.  Appreciate palliative cares assist with symptom  management.   PLAN -  Cont cipro for EColi UTI  Cont synthroid, octreotide, midodrine  Renal following  Cont low dose fentanyl patch and PRN dilaudid for pain  Ativan PRN  Comfort feeds    Will ask [facility] to resume care in am 12/21.     Danford Bad, NP 02/24/2013  9:00 AM Pager: [phone] or [phone]  *Care during the described time interval was provided by me and/or other providers on the critical care team. I have reviewed this patient's available data, including medical history, events of note, physical examination and test results as part of my evaluation.  I was present and examined this pt on 02/24/13.  I agree with the above plan of care.  Need to shift more to comfort measures only status.  Dorcas Carrow Beeper  [phone]  Cell  [phone]  If no response or cell goes to voicemail, call beeper [phone]

## 2013-02-25 DIAGNOSIS — B1920 Unspecified viral hepatitis C without hepatic coma: Secondary | ICD-10-CM

## 2013-02-25 DIAGNOSIS — F1011 Alcohol abuse, in remission: Secondary | ICD-10-CM

## 2013-02-25 DIAGNOSIS — I959 Hypotension, unspecified: Secondary | ICD-10-CM

## 2013-02-25 DIAGNOSIS — E876 Hypokalemia: Secondary | ICD-10-CM

## 2013-02-25 DIAGNOSIS — E872 Acidosis, unspecified: Secondary | ICD-10-CM

## 2013-02-25 DIAGNOSIS — R17 Unspecified jaundice: Secondary | ICD-10-CM

## 2013-02-25 DIAGNOSIS — R109 Unspecified abdominal pain: Secondary | ICD-10-CM

## 2013-02-25 DIAGNOSIS — E871 Hypo-osmolality and hyponatremia: Secondary | ICD-10-CM

## 2013-02-25 MED ORDER — SODIUM CHLORIDE 0.9 % IV SOLN
0.5000 mg/h | INTRAVENOUS | Status: DC
  Administered 2013-02-25: 0.5 mg/h via INTRAVENOUS
  Filled 2013-02-25: qty 2.5

## 2013-02-25 MED ORDER — HYDROMORPHONE BOLUS VIA INFUSION
0.5000 mg | INTRAVENOUS | Status: DC | PRN
  Filled 2013-02-25: qty 1

## 2013-02-25 NOTE — Progress Notes (Signed)
Nutrition Brief Note  Chart reviewed. Pt now transitioning to comfort care.  No further nutrition interventions warranted at this time.  Please re-consult as needed.   Matthew Pais RD, LDN, CNSC [phone] Pager [phone] After Hours Pager    

## 2013-02-25 NOTE — Progress Notes (Signed)
Progress Note from the Palliative Medicine Team at [facility]  Subjective: Spoke with Teresa House at bedside. Teresa House is moaning and agitated. Spoke with nursing about giving her Ativan and Dilaudid doses for comfort. I also removed her oxygen as it is agitating her and Teresa House understands that the oxygen is only treating a number and agrees to remove this for her comfort understanding that her oxygen levels can drop. Encouraged Teresa House to request pain medication when she is flailing her arms, moaning, grimacing as she is now. Requested nursing to monitor for same signs and to give medication for comfort. D/C po meds. I do not believe her stable to transfer for residential hospice as her pain and agitation are not controlled at the present time. I will add a Hydromorphone continuous infusion for relief. I spoke with Teresa House about adding the infusion and he is on board with anything to keep her comfortable. Spoke with pharmacy and nursing about initiating infusion. Will continue to follow for symptom management and holistic support.   Objective: Allergies  Allergen Reactions  . Penicillins Other (See Comments)    Childhood reaction    Scheduled Meds: . antiseptic oral rinse  15 mL Mouth Rinse BID  . fentaNYL  12.5 mcg Transdermal Q72H  . nicotine  21 mg Transdermal Q24H  . octreotide  100 mcg Subcutaneous BID  . sodium chloride  3 mL Intravenous Q12H   Continuous Infusions:  PRN Meds:.HYDROmorphone (DILAUDID) injection, LORazepam, ondansetron (ZOFRAN) IV, ondansetron, sodium chloride  BP 70/40  Pulse 79  Temp(Src) 97.4 F (36.3 C) (Axillary)  Resp 19  Ht 5\' 6"  (1.676 m)  Wt 68.675 kg (151 lb 6.4 oz)  BMI 24.45 kg/m2  SpO2 85%   PPS: 20%     Intake/Output Summary (Last 24 hours) at 02/25/13 1130 Last data filed at 02/25/13 0900  Gross per 24 hour  Intake      0 ml  Output      0 ml  Net      0 ml      LBM: 02/25/13      Physical Exam:  General: Ill appearing, jaundice, labored breathing -  appears uncomfortable HEENT:  Jaundice, [place]/AT, dry mucous membranes - oral care done Chest: Labored breathing, pauses, decreased bases CVS: RRR Abdomen: Distended - ascites Ext: BLE 2+ edema, BLE cool to touch Neuro: Lethargic, unable to converse, moans  Labs: CBC    Component Value Date/Time   WBC 37.2* 02/17/2013 0545   RBC 3.28* 02/17/2013 0545   RBC 4.04 07/15/2008 1248   HGB 12.7 02/17/2013 0545   HCT 34.3* 02/17/2013 0545   PLT 156 02/17/2013 0545   MCV 104.6* 02/17/2013 0545   MCH 38.7* 02/17/2013 0545   MCHC 37.0* 02/17/2013 0545   RDW 15.1 02/17/2013 0545   LYMPHSABS 0.8 02/16/2013 1851   MONOABS 1.1* 02/19/2013 1851   EOSABS 0.1 02/23/2013 1851   BASOSABS 0.0 02/13/2013 1851    BMET    Component Value Date/Time   NA 119* 02/22/2013 0545   K 3.9 02/22/2013 0545   CL 82* 02/22/2013 0545   CO2 23 02/22/2013 0545   GLUCOSE 105* 02/22/2013 0545   BUN 76* 02/22/2013 0545   CREATININE 2.25* 02/22/2013 0545   CALCIUM 7.7* 02/22/2013 0545   GFRNONAA 25* 02/22/2013 0545   GFRAA 29* 02/22/2013 0545    CMP     Component Value Date/Time   NA 119* 02/22/2013 0545   K 3.9 02/22/2013 0545   CL 82* 02/22/2013 0545  CO2 23 02/22/2013 0545   GLUCOSE 105* 02/22/2013 0545   BUN 76* 02/22/2013 0545   CREATININE 2.25* 02/22/2013 0545   CALCIUM 7.7* 02/22/2013 0545   PROT 3.9* 02/21/2013 0445   ALBUMIN 2.1* 02/21/2013 0445   AST 179* 02/21/2013 0445   ALT 118* 02/21/2013 0445   ALKPHOS 102 02/21/2013 0445   BILITOT 23.6* 02/21/2013 0445   GFRNONAA 25* 02/22/2013 0545   GFRAA 29* 02/22/2013 0545     Assessment and Plan: 1. Code Status: DNR 2. Symptom Control: 1. Pain: Initiate Hydromorphone infusion with prn boluses for pain control. Nursing to please monitor and bolus and call if frequent boluses required for comfort. Continue Fentany duragesic in place. 2. Anxiety: Ativan prn. 3. Psycho/Social: Emotional support provided to [facility] and patient. 4. Disposition:  Anticipate death soon.    Time In Time Out Total Time Spent with Patient Total Overall Time  0915 1000     Greater than 50%  of this time was spent counseling and coordinating care related to the above assessment and plan.  Yong Channel, NP Palliative Medicine Team Team Phone # [phone]   1

## 2013-02-25 NOTE — Progress Notes (Signed)
[facility]                                                                                Patient Demographics  Teresa House, is a 47 y.o. female, DOB - 10/31/1965, ZOX:[id number]  Admit date - 02/18/2013   Admitting Physician Eduard Clos, MD  Outpatient Primary MD for the patient is No PCP Per Patient  LOS - 14   Chief Complaint  Patient presents with  . 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 She developed renal cysts daily her with conservative hepatorenal syndrome. Patient is not a transplant candidate. She was transferred to the ICU for Levophed therapy. Patient's renal function has been worsening and has been transitioned to comfort care. Palliative care has also been consulted. We'll continue fentanyl patch, Dilaudid, and Ativan for comfort.  Code Status: DO NOT RESUSCITATE, DO NOT INTUBATE, comfort measures.  Family Communication: Husband at bedside  Disposition Plan: Currently admitted.   Procedures/Course 12/10 Paracentesis >> 2.4 liters, GI consulted  12/14 Renal consulted, transfer to ICU for levophed therapy  12/20 - goals of care -- pt now DNR, comfort care   Consults   Palliative care Nephrology PCCM.  DVT Prophylaxis  SCDs  Lab Results  Component Value Date   PLT 156 02/17/2013    Medications  Scheduled Meds: . antiseptic oral rinse  15 mL Mouth Rinse BID  . fentaNYL  12.5 mcg Transdermal Q72H  . nicotine  21 mg Transdermal Q24H  . octreotide  100 mcg Subcutaneous BID  . sodium chloride  3 mL Intravenous Q12H   Continuous Infusions: . HYDROmorphone 0.5 mg/hr (02/25/13 1437)   PRN  Meds:.HYDROmorphone, LORazepam, ondansetron (ZOFRAN) IV, ondansetron, sodium chloride  Antibiotics    Anti-infectives   Start     Dose/Rate Route Frequency Ordered Stop   02/23/13 0800  ciprofloxacin (CIPRO) tablet 500 mg  Status:  Discontinued     500 mg Oral Daily with breakfast 02/22/13 1305 02/24/13 1327   02/20/13 1200  ciprofloxacin (CIPRO) IVPB 400 mg  Status:  Discontinued     400 mg 200 mL/hr over 60 Minutes Intravenous Every 12 hours 02/20/13 1109 02/22/13 1305   02/18/13 1230  ciprofloxacin (CIPRO) IVPB 400 mg  Status:  Discontinued     400 mg 200 mL/hr over 60 Minutes Intravenous Every 24 hours 02/17/13 1328 02/20/13 1109   02/15/13 2200  metroNIDAZOLE (FLAGYL) IVPB 500 mg  Status:  Discontinued     500 mg 100 mL/hr over 60 Minutes Intravenous Every 12 hours 02/15/13 1120 02/21/13 1136   02/12/13 0730  metroNIDAZOLE (FLAGYL) IVPB 500 mg  Status:  Discontinued    Comments:  For vaginal trich   500 mg 100 mL/hr over 60 Minutes Intravenous Every 12 hours 02/12/13 0718 02/12/13 0721   02/12/13 0100  ciprofloxacin (CIPRO) IVPB 400 mg  Status:  Discontinued     400 mg 200 mL/hr  over 60 Minutes Intravenous Every 12 hours February 22, 2013 2319 02/17/13 1328   February 22, 2013 2315  metroNIDAZOLE (FLAGYL) IVPB 500 mg  Status:  Discontinued     500 mg 100 mL/hr over 60 Minutes Intravenous Every 8 hours 02-22-13 2310 02/15/13 1120   Feb 22, 2013 2130  cefTRIAXone (ROCEPHIN) 1 g in dextrose 5 % 50 mL IVPB     1 g 100 mL/hr over 30 Minutes Intravenous  Once February 22, 2013 2116 02/22/13 2158       Time Spent in minutes  25 minutes   Lativia Velie D.O. on 02/25/2013 at 4:14 PM  Between 7am to 7pm - Pager - [phone]  After 7pm go to [email or web address] - password [email or web address]  And look for the night coverage person covering for me after hours  [facility] Office  [phone]    Subjective:   Teresa House seen and examined today.      Objective:   Filed Vitals:   02/23/13 2100  02/24/13 0500 02/24/13 2330 02/25/13 1014  BP: 75/42 72/43 75/44  70/40  Pulse: 99 94 76 79  Temp: 97.7 F (36.5 C) 97.5 F (36.4 C) 98.6 F (37 C) 97.4 F (36.3 C)  TempSrc: Oral Oral Oral Axillary  Resp: 18  19 19   Height:      Weight:   68.675 kg (151 lb 6.4 oz)   SpO2: 95% 88% 88% 85%    Wt Readings from Last 3 Encounters:  02/24/13 68.675 kg (151 lb 6.4 oz)  02/06/13 55.43 kg (122 lb 3.2 oz)     Intake/Output Summary (Last 24 hours) at 02/25/13 1614 Last data filed at 02/25/13 0900  Gross per 24 hour  Intake      0 ml  Output      0 ml  Net      0 ml    Exam  General: Well developed, NAD  HEENT: jaundice, NCAT  Neck: Supple, no JVD, no masses  Cardiovascular: S1 S2 auscultated, no rubs, murmurs or gallops. Regular rate and rhythm.  Respiratory: Decreased breath sounds at the bases.  Abdomen: Distended, nontender  Extremities: 2+ pitting edema lower extremities bilaterally  Neuro: Lethargic, opens eyes.  Skin: Jaundice  Data Review   Micro Results No results found for this or any previous visit (from the past 240 hour(s)).  Radiology Reports Nm Hepatobiliary Liver Func  02/12/2013   CLINICAL DATA:  Nausea. Elevated bilirubin. Abdominal pain and jaundice.  EXAM: NUCLEAR MEDICINE HEPATOBILIARY IMAGING  TECHNIQUE: Sequential images of the abdomen were obtained out to 60 minutes following intravenous administration of radiopharmaceutical.  COMPARISON:  None.  RADIOPHARMACEUTICALS:  Tc-83m Choletec  FINDINGS: There was no excretion of contrast into the biliary tree at 2 hr. I suspect the patient has severe hepatocellular disease.  IMPRESSION: Indeterminate hepatobiliary scan with no visualization of the biliary tree secondary to severe hepatocellular disease.   Electronically Signed   By: Geanie Cooley M.D.   On: 02/12/2013 16:09   US Abdomen Complete  02-22-2013   CLINICAL DATA:  Abdominal pain.  History of hypertension.  EXAM: ULTRASOUND ABDOMEN  COMPLETE  COMPARISON:  None.  FINDINGS: Gallbladder:  The gallbladder wall is thickened at 6.6 mm. Within the gallbladder, there is significant layering sludge. No definite stones are identified.  Common bile duct:  Diameter: 8.2 mm without definite stones.  Liver:  Enlarged and echogenic. Question nodular surface. Perihepatic fluid.  IVC:  No abnormality visualized.  Pancreas:  Visualized portion has a normal appearance. There is poor visualization  because of overlying bowel gas however.  Spleen:  Normal in appearance, 11.0 cm.  Right Kidney:  Length: 10.7 cm. Echogenicity within normal limits. No mass or hydronephrosis visualized.  Left Kidney:  Length: 12.7 cm. Echogenicity within normal limits. No mass or hydronephrosis visualized.  Abdominal aorta:  No aneurysm visualized.  Other findings:  Note is made of dampened flow within the main portal vein, noted to be approximately 11 centimeters/second. Findings raise the question portal venous hypertension. Note is made of ascites. .  IMPRESSION: 1. Nodular contour of the liver, raising the question of cirrhosis. 2. No focal liver lesion identified. 3. Ascites. 4. Question of portal venous hypertension.  See above. 5. Normal appearance of the kidneys. 6. Thickened gallbladder wall with significant sludge.   Electronically Signed   By: Rosalie Gums M.D.   On: February 15, 2013 22:16   US Paracentesis  02/12/2013   CLINICAL DATA:  Jaundice, cirrhosis, recurrent ascites  EXAM: ULTRASOUND GUIDED PARACENTESIS  TECHNIQUE: Survey ultrasound of the abdomen was performed and an appropriate skin entry site in the LLQ abdomen was selected. Skin site was marked, prepped with Betadine, and draped in usual sterile fashion, and infiltrated locally with 1% lidocaine. A 5 French multisidehole Yueh sheath needle was advanced into the peritoneal space until fluid could be aspirated. The sheath was advanced and the needle removed. 2.4 liters of bright yellow ascites were aspirated. No  immediate complication. The fluid was sent for testing.  IMPRESSION: Technically successful ultrasound guided paracentesis, removing 2.4 liters of ascites.  Read By:  Pattricia Boss PA-C   Electronically Signed   By: Irish Lack M.D.   On: 02/12/2013 11:30   Dg Chest Port 1 View  02/16/2013   CLINICAL DATA:  Central line placement, hypertension  EXAM: PORTABLE CHEST - 1 VIEW  COMPARISON:  Portable exam 1825 hr compared to 02/15/2013  FINDINGS: Right jugular central venous catheter with tip projecting over the cavoatrial junction.  Normal heart size, mediastinal contours and pulmonary vascularity.  Bibasilar atelectasis.  Chronic elevation right diaphragm.  No pulmonary infiltrate, pleural effusion or pneumothorax.  Bones unremarkable.  IMPRESSION: No pneumothorax following right jugular line placement.  Bibasilar atelectasis.   Electronically Signed   By: Ulyses Southward M.D.   On: 02/16/2013 18:53   Dg Chest Port 1 View  Feb 15, 2013   CLINICAL DATA:  Check for infiltrates.  EXAM: PORTABLE CHEST - 1 VIEW  COMPARISON:  07/15/2008  FINDINGS: Shallow inflation. Heart size is normal. There are no focal consolidations. Suspect minimal left lower lobe atelectasis. No edema.  IMPRESSION: 1. Shallow inflation. 2. Left lower lobe atelectasis.   Electronically Signed   By: Rosalie Gums M.D.   On: 02-15-2013 23:37    CBC No results found for this basename: WBC, HGB, HCT, PLT, MCV, MCH, MCHC, RDW, NEUTRABS, LYMPHSABS, MONOABS, EOSABS, BASOSABS, BANDABS, BANDSABD,  in the last 168 hours  Chemistries   Recent Labs Lab 02/19/13 0500 02/20/13 0420 02/21/13 0445 02/22/13 0545  NA 120* 118* 119* 119*  K 3.0* 2.9* 3.5 3.9  CL 88* 85* 86* 82*  CO2 16* 19 19 23   GLUCOSE 159* 157* 124* 105*  BUN 57* 60* 64* 76*  CREATININE 1.73* 1.70* 1.84* 2.25*  CALCIUM 7.0* 7.2* 6.6* 7.7*  MG  --  2.2  --   --   AST 287*  --  179*  --   ALT 153*  --  118*  --   ALKPHOS 100  --  102  --  BILITOT 29.3*  --  23.6*  --     ------------------------------------------------------------------------------------------------------------------ estimated creatinine clearance is 29.6 ml/min (by C-G formula based on Cr of 2.25). ------------------------------------------------------------------------------------------------------------------ No results found for this basename: HGBA1C,  in the last 72 hours ------------------------------------------------------------------------------------------------------------------ No results found for this basename: CHOL, HDL, LDLCALC, TRIG, CHOLHDL, LDLDIRECT,  in the last 72 hours ------------------------------------------------------------------------------------------------------------------ No results found for this basename: TSH, T4TOTAL, FREET3, T3FREE, THYROIDAB,  in the last 72 hours ------------------------------------------------------------------------------------------------------------------ No results found for this basename: VITAMINB12, FOLATE, FERRITIN, TIBC, IRON, RETICCTPCT,  in the last 72 hours  Coagulation profile No results found for this basename: INR, PROTIME,  in the last 168 hours  No results found for this basename: DDIMER,  in the last 72 hours  Cardiac Enzymes No results found for this basename: CK, CKMB, TROPONINI, MYOGLOBIN,  in the last 168 hours ------------------------------------------------------------------------------------------------------------------ No components found with this basename: POCBNP,

## 2013-03-06 NOTE — Progress Notes (Signed)
Patient's husband/significant other has not made a decision on cremation/funeral arrangements.  Notified family that the patient would be held in the morgue until he makes a decision.  Gave him the unit number and the main hospital number.  Peri Maris, MBA, BS, RN

## 2013-03-06 NOTE — Progress Notes (Signed)
32ml from Dilaudid drip wasted.  Witnessed by Fayne Norrie, RN.  Peri Maris, MBA, BS, RN

## 2013-03-06 NOTE — Progress Notes (Signed)
Teresa House appeared much more comfortable to me this morning on the Dilaudid infusion. Teresa House said that she seemed more comfortable over the night and has not been moaning or thrashing her arms as she was yesterday prior to the infusion. She is resting in the bed and is having 5-10 second pauses in respirations and her feet are mottled. Teresa House is at bedside and emotional support is provided to him at this time. His only concern is for Teresa House to be comfortable at this time. He does not have much support but says he has family planning to come visit with him after the new year. I spoke with nursing and they know they have the ability to bolus the infusion and Ativan prn if needed. I will continue to follow and support holistically.  Yong Channel, NP Palliative Medicine Team Team Phone # [phone]

## 2013-03-06 NOTE — Progress Notes (Signed)
Patient expired at 09:35.  Respirations & pulse absent.  Verified by Melvyn Novas, RN.  Peri Maris, MBA, BS, RN

## 2013-03-06 DEATH — deceased

## 2013-03-08 NOTE — Progress Notes (Signed)
I have reviewed and discussed the care of this patient in detail with the nurse practitioner including pertinent patient records, physical exam findings and data. I agree with details of this encounter.  

## 2013-05-15 NOTE — Discharge Summary (Signed)
Death Summary  Teresa House Shontz QIH:[id number] DOB: [birth year] DOA: 02/09/2013  PCP: No PCP Per Patient   Admit date: 02/27/2013 Date of Death: 2012/06/01  Final Diagnoses:  Possible hepatorenal syndrome  Acute renal failure with oliguria  Decompensated alcoholic cirrhosis  Ascites  Hyponatremia  Metabolic acidosis  Escherichia coli UTI  Trichomonas  Possible hypothyroidism  History of present illness:  Teresa House Liston is a 48 y.o. female presents to the ER for the second time within a week for worsening jaundice. In addition patient also has noticed abdominal discomfort. Patient feels that her abdomen is getting more distended. She denies any nausea vomiting diarrhea fever chills. In the ER patient was found to have elevated LFTs with total bilirubin around 26.9. Sonogram of abdomen shows features concerning for cirrhosis of the liver in addition to ascites. Labs also show severe hyponatremia. 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 She has been slowly tapering off her alcohol intake over the last 2 months. Denies any chest pain or shortness of breath.   Hospital Course:  48 yo female smoker with cirrhosis from ETOH admitted with progressive jaundice, bloating, and abdominal discomfort. She developed renal failure with concern for hepatorenal syndrome. Not transplant candidate. She was transferred to ICU for levophed therapy, and PCCM assumed care. Renal function worsened and patient was transitioned to comfort care after meetings with palliative care.  She was transferred to the medical floor and was continued on fentanyl patch, Dilaudid, and Ativan for comfort.  Patient expired on 2012/06/01 at 9:35am.    SIGNIFICANT EVENTS:  12/10 Paracentesis >> 2.4 liters, GI consulted  12/14 Renal consulted, transfer to ICU for levophed therapy  12/20 - goals of care -- pt now DNR, comfort care   STUDIES:   12/09 Abd u/s >> cirrhosis, ascites, normal kidneys, gallbladder sludge  12/09 Heptobiliary scan >> Indeterminate hepatobiliary scan with no visualization of the biliary tree secondary to severe hepatocellular disease.  Acute hep panel 12/09 >> HCV Ab reactive  HIV 12/10 >> non reactive  12/10 Urine Na < 10, Urine osmo 257  Hep C viral load 12/11 >> negative   Time: 15 minutes  Signed:  Edsel PetrinMIKHAIL, Misa Fedorko  [facility] 05/15/2013, 6:01 PM

## 2014-06-15 IMAGING — CR DG CHEST 1V PORT
1 series · 1 of 1 positions shown · non-contrast
Comparison: Portable exam 1365 hr compared to 02/11/2013

CLINICAL DATA: Central line placement, hypertension

EXAM:
PORTABLE CHEST - 1 VIEW

[AP]
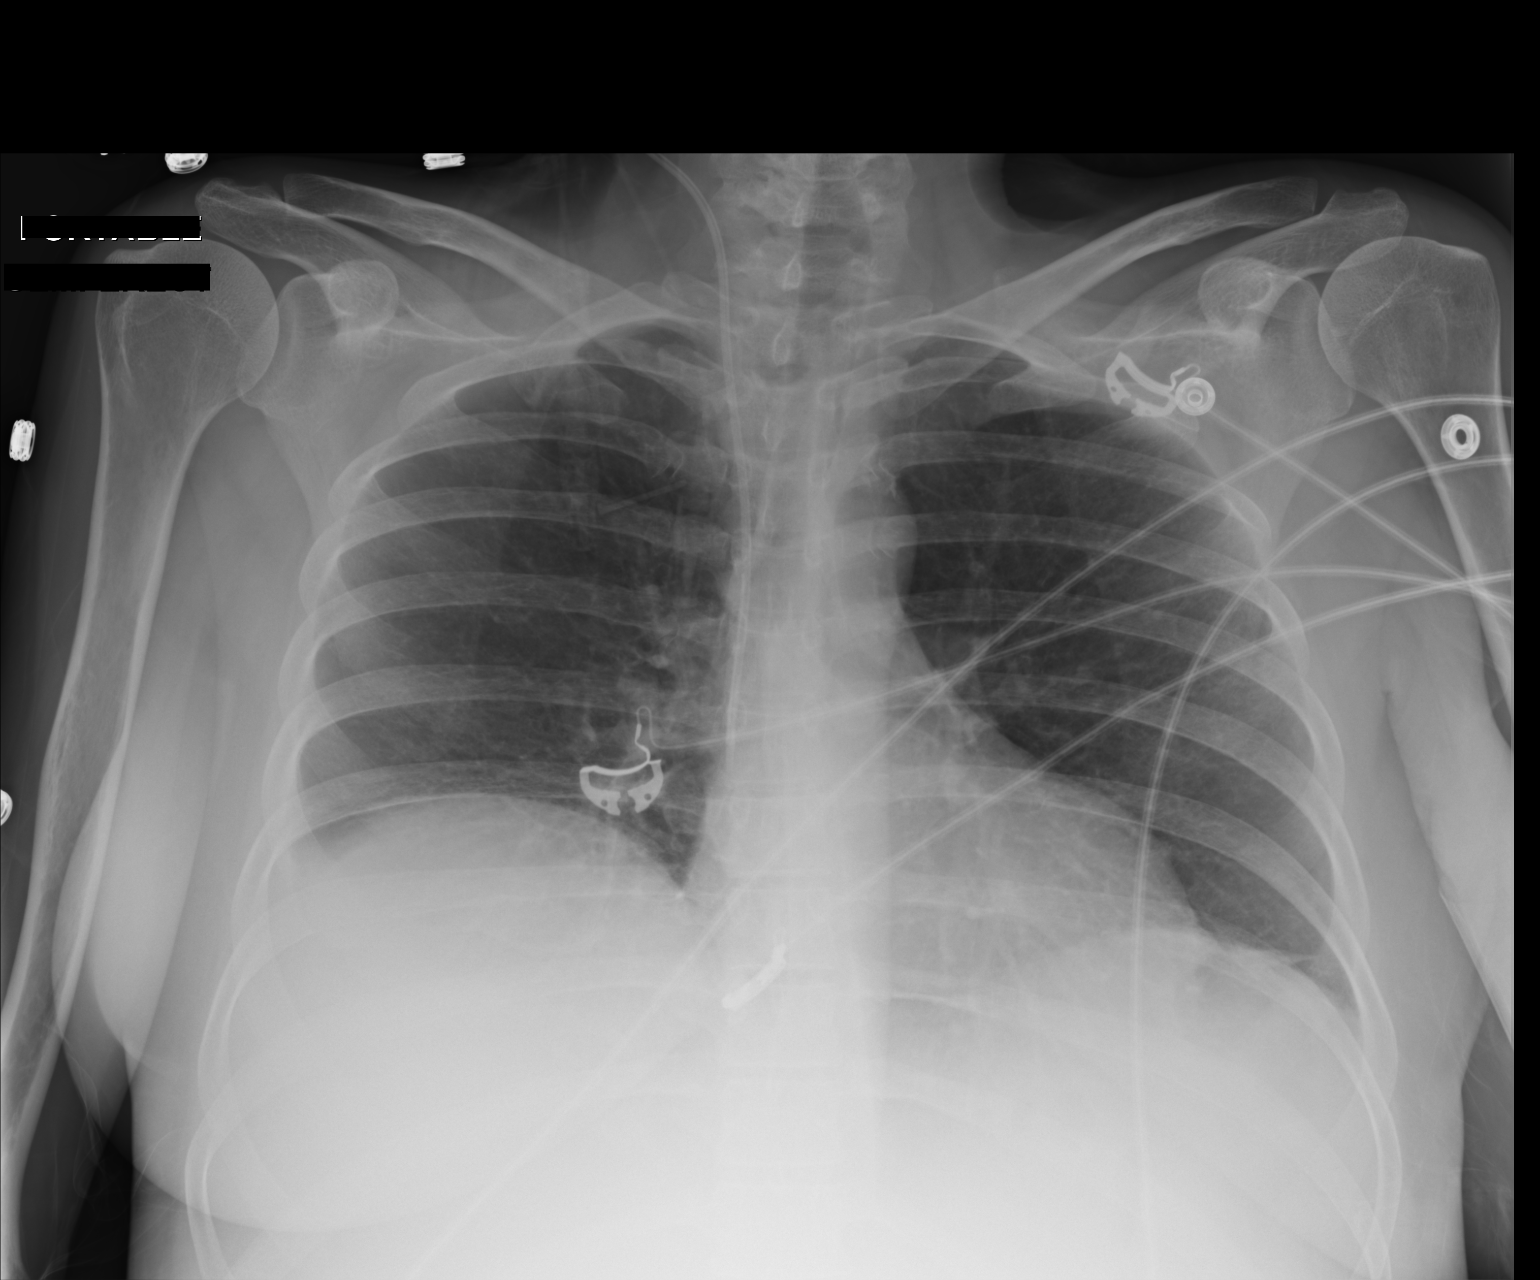

[1 of 1 positions shown; findings below may reference images not displayed]

FINDINGS: Right jugular central venous catheter with tip projecting over the
cavoatrial junction.

Normal heart size, mediastinal contours and pulmonary vascularity.

Bibasilar atelectasis.

Chronic elevation right diaphragm.

No pulmonary infiltrate, pleural effusion or pneumothorax.

Bones unremarkable.
IMPRESSION: No pneumothorax following right jugular line placement.

Bibasilar atelectasis.
